# Patient Record
Sex: Female | Born: 1985 | Hispanic: Yes | Marital: Married | Smoking: Former smoker
Health system: Southern US, Community
[De-identification: ages and names within clinical notes are randomized; demographics above are authoritative.]

## PROBLEM LIST (undated history)

## (undated) DIAGNOSIS — N63 Unspecified lump in unspecified breast: Secondary | ICD-10-CM

## (undated) DIAGNOSIS — F32A Depression, unspecified: Secondary | ICD-10-CM

## (undated) DIAGNOSIS — R87629 Unspecified abnormal cytological findings in specimens from vagina: Secondary | ICD-10-CM

## (undated) DIAGNOSIS — F419 Anxiety disorder, unspecified: Secondary | ICD-10-CM

## (undated) DIAGNOSIS — N189 Chronic kidney disease, unspecified: Secondary | ICD-10-CM

## (undated) HISTORY — PX: BREAST SURGERY: SHX581

## (undated) HISTORY — DX: Depression, unspecified: F32.A

## (undated) HISTORY — DX: Unspecified lump in unspecified breast: N63.0

## (undated) HISTORY — PX: TONSILLECTOMY: SUR1361

## (undated) HISTORY — DX: Anxiety disorder, unspecified: F41.9

---

## 2013-03-23 ENCOUNTER — Encounter (HOSPITAL_COMMUNITY): Payer: Self-pay | Admitting: Emergency Medicine

## 2013-03-23 ENCOUNTER — Emergency Department (HOSPITAL_COMMUNITY)
Admission: EM | Admit: 2013-03-23 | Discharge: 2013-03-23 | Disposition: A | Discharge status: Home / Self Care | Payer: Self-pay | Attending: Emergency Medicine | Admitting: Emergency Medicine

## 2013-03-23 DIAGNOSIS — R52 Pain, unspecified: Secondary | ICD-10-CM | POA: Insufficient documentation

## 2013-03-23 DIAGNOSIS — Z3202 Encounter for pregnancy test, result negative: Secondary | ICD-10-CM | POA: Insufficient documentation

## 2013-03-23 DIAGNOSIS — J029 Acute pharyngitis, unspecified: Secondary | ICD-10-CM | POA: Insufficient documentation

## 2013-03-23 DIAGNOSIS — IMO0001 Reserved for inherently not codable concepts without codable children: Secondary | ICD-10-CM | POA: Insufficient documentation

## 2013-03-23 DIAGNOSIS — B9789 Other viral agents as the cause of diseases classified elsewhere: Secondary | ICD-10-CM | POA: Insufficient documentation

## 2013-03-23 DIAGNOSIS — B349 Viral infection, unspecified: Secondary | ICD-10-CM

## 2013-03-23 DIAGNOSIS — R5381 Other malaise: Secondary | ICD-10-CM | POA: Insufficient documentation

## 2013-03-23 DIAGNOSIS — R197 Diarrhea, unspecified: Secondary | ICD-10-CM | POA: Insufficient documentation

## 2013-03-23 DIAGNOSIS — R630 Anorexia: Secondary | ICD-10-CM | POA: Insufficient documentation

## 2013-03-23 LAB — CBC WITH DIFFERENTIAL/PLATELET
Hemoglobin: 13.9 g/dL (ref 12.0–15.0)
Lymphocytes Relative: 5 % — ABNORMAL LOW (ref 12–46)
Lymphs Abs: 0.7 10*3/uL (ref 0.7–4.0)
Monocytes Relative: 4 % (ref 3–12)
Neutrophils Relative %: 91 % — ABNORMAL HIGH (ref 43–77)
Platelets: 199 10*3/uL (ref 150–400)
RBC: 4.77 MIL/uL (ref 3.87–5.11)
WBC: 13.3 10*3/uL — ABNORMAL HIGH (ref 4.0–10.5)

## 2013-03-23 LAB — COMPREHENSIVE METABOLIC PANEL
ALT: 13 U/L (ref 0–35)
Alkaline Phosphatase: 69 U/L (ref 39–117)
BUN: 8 mg/dL (ref 6–23)
CO2: 25 mEq/L (ref 19–32)
Chloride: 97 mEq/L (ref 96–112)
GFR calc Af Amer: >90 mL/min (ref >90)
GFR calc non Af Amer: 86 mL/min — ABNORMAL LOW (ref >90)
Glucose, Bld: 118 mg/dL — ABNORMAL HIGH (ref 70–99)
Potassium: 3.3 mEq/L — ABNORMAL LOW (ref 3.5–5.1)
Sodium: 132 mEq/L — ABNORMAL LOW (ref 135–145)
Total Bilirubin: 0.3 mg/dL (ref 0.3–1.2)

## 2013-03-23 LAB — URINALYSIS, ROUTINE W REFLEX MICROSCOPIC
Hgb urine dipstick: NEGATIVE
Nitrite: NEGATIVE
Specific Gravity, Urine: 1.031 — ABNORMAL HIGH (ref 1.005–1.030)
Urobilinogen, UA: 1 mg/dL (ref 0.0–1.0)
pH: 6 (ref 5.0–8.0)

## 2013-03-23 LAB — URINE MICROSCOPIC-ADD ON

## 2013-03-23 MED ORDER — ACETAMINOPHEN 325 MG PO TABS
650.0000 mg | ORAL_TABLET | Freq: Once | ORAL | Status: AC
  Administered 2013-03-23: 650 mg via ORAL
  Filled 2013-03-23: qty 2

## 2013-03-23 NOTE — ED Provider Notes (Signed)
History     CSN: [id number]  Arrival date & time 03/23/13  1931   First MD Initiated Contact with Patient 03/23/13 2046      Chief Complaint  Patient presents with  . Fever   HPI  History provided by the patient. Patient is a 27 year old female with no significant PMH who presents with complaints of fever, chills and body aches. Symptoms first began Sunday evening and have been persistent. There were also accompanied with several episodes of soft diarrhea without blood or mucus. Patient also reports decreased appetite and some discomfort with swallowing in the throat neck area. Patient has been using DayQuil and NyQuil which has improved her fever temporarily. Diarrhea symptoms have also improved. Today patient reports having slight return of her appetite. She does also admit to poor fluid intake. She denies any other associated symptoms. No nausea vomiting. No abdominal pain. No dysuria, hematuria urinary frequency. No menstrual change. No vaginal bleeding or discharge. She denies any recent travel. No specific known sick contacts. 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    History reviewed. No pertinent past medical history.  Past Surgical History  Procedure Laterality Date  . Tonsillectomy    . Breast surgery      No family history on file.  History  Substance Use Topics  . Smoking status: Never Smoker   . Smokeless tobacco: Not on file  . Alcohol Use: No    OB History   Grav Para Term Preterm Abortions TAB SAB Ect Mult Living                  Review of Systems  Constitutional: Positive for fever, chills, appetite change and fatigue.  HENT: Positive for sore throat. Negative for congestion and rhinorrhea.   Respiratory: Negative for cough.   Cardiovascular: Negative for chest pain.  Gastrointestinal: Positive for diarrhea. Negative for nausea, vomiting, abdominal pain, constipation and blood in stool.  Genitourinary: Negative for dysuria, frequency,  hematuria, flank pain, vaginal bleeding, vaginal discharge and menstrual problem.  Musculoskeletal: Positive for myalgias.  All other systems reviewed and are negative.    Allergies  Review of patient's allergies indicates not on file.  Home Medications  No current outpatient prescriptions on file.  BP 122/85  Pulse 112  Temp(Src) 101.8 F (38.8 C) (Oral)  Resp 14  SpO2 99%  LMP 03/18/2013  Physical Exam  Nursing note and vitals reviewed. Constitutional: She is oriented to person, place, and time. She appears well-developed and well-nourished. No distress.  HENT:  Head: Normocephalic.  Right Ear: Tympanic membrane normal.  Left Ear: Tympanic membrane normal.  Mouth/Throat: Oropharynx is clear and moist.  No significant erythema of pharynx. Uvula midline. No ulcers or lesions of the mouth and gums. No petechia  Neck: Normal range of motion. Neck supple.  No meningeal signs  Cardiovascular: Normal rate and regular rhythm.   No murmur heard. Pulmonary/Chest: Effort normal and breath sounds normal. No respiratory distress. She has no wheezes. She has no rales.  Abdominal: Soft. There is no tenderness. There is no rebound.  Musculoskeletal: Normal range of motion. She exhibits no edema and no tenderness.  Lymphadenopathy:    She has no cervical adenopathy.  Neurological: She is alert and oriented to person, place, and time.  Skin: Skin is warm and dry. No rash noted.  Psychiatric: She has a normal mood and affect. Her behavior is normal.    ED Course  Procedures   Results for orders  placed during the hospital encounter of 03/23/13  URINALYSIS, ROUTINE W REFLEX MICROSCOPIC      Result Value Range   Color, Urine AMBER (*) YELLOW   APPearance HAZY (*) CLEAR   Specific Gravity, Urine 1.031 (*) 1.005 - 1.030   pH 6.0  5.0 - 8.0   Glucose, UA NEGATIVE  NEGATIVE mg/dL   Hgb urine dipstick NEGATIVE  NEGATIVE   Bilirubin Urine SMALL (*) NEGATIVE   Ketones, ur 40 (*) NEGATIVE  mg/dL   Protein, ur 30 (*) NEGATIVE mg/dL   Urobilinogen, UA 1.0  0.0 - 1.0 mg/dL   Nitrite NEGATIVE  NEGATIVE   Leukocytes, UA NEGATIVE  NEGATIVE  CBC WITH DIFFERENTIAL      Result Value Range   WBC 13.3 (*) 4.0 - 10.5 K/uL   RBC 4.77  3.87 - 5.11 MIL/uL   Hemoglobin 13.9  12.0 - 15.0 g/dL   HCT 16.1  09.6 - 04.5 %   MCV 83.6  78.0 - 100.0 fL   MCH 29.1  26.0 - 34.0 pg   MCHC 34.8  30.0 - 36.0 g/dL   RDW 40.9  81.1 - 91.4 %   Platelets 199  150 - 400 K/uL   Neutrophils Relative 91 (*) 43 - 77 %   Neutro Abs 12.1 (*) 1.7 - 7.7 K/uL   Lymphocytes Relative 5 (*) 12 - 46 %   Lymphs Abs 0.7  0.7 - 4.0 K/uL   Monocytes Relative 4  3 - 12 %   Monocytes Absolute 0.5  0.1 - 1.0 K/uL   Eosinophils Relative 0  0 - 5 %   Eosinophils Absolute 0.0  0.0 - 0.7 K/uL   Basophils Relative 0  0 - 1 %   Basophils Absolute 0.0  0.0 - 0.1 K/uL  COMPREHENSIVE METABOLIC PANEL      Result Value Range   Sodium 132 (*) 135 - 145 mEq/L   Potassium 3.3 (*) 3.5 - 5.1 mEq/L   Chloride 97  96 - 112 mEq/L   CO2 25  19 - 32 mEq/L   Glucose, Bld 118 (*) 70 - 99 mg/dL   BUN 8  6 - 23 mg/dL   Creatinine, Ser 7.82  0.50 - 1.10 mg/dL   Calcium 9.2  8.4 - 95.6 mg/dL   Total Protein 7.8  6.0 - 8.3 g/dL   Albumin 4.0  3.5 - 5.2 g/dL   AST 22  0 - 37 U/L   ALT 13  0 - 35 U/L   Alkaline Phosphatase 69  39 - 117 U/L   Total Bilirubin 0.3  0.3 - 1.2 mg/dL   GFR calc non Af Amer 86 (*) >90 mL/min   GFR calc Af Amer >90  >90 mL/min  URINE MICROSCOPIC-ADD ON      Result Value Range   Squamous Epithelial / LPF FEW (*) RARE   WBC, UA 0-2  <3 WBC/hpf   RBC / HPF 0-2  <3 RBC/hpf   Bacteria, UA FEW (*) RARE   Urine-Other MUCOUS PRESENT    POCT PREGNANCY, URINE      Result Value Range   Preg Test, Ur NEGATIVE  NEGATIVE        1. Viral infection       MDM  8:40 PM patient seen and evaluated. Patient in no acute distress. Patient well-appearing does not appear toxic. Symptoms consistent with viral  infection. Patient without nausea vomiting and able to tolerate by mouth fluids. She does show some  signs of slight dehydration. IV with fluids offered patient refused. She has been strongly encouraged to drink plenty of fluids.        Angus Seller, PA-C 03/23/13 2117

## 2013-03-23 NOTE — ED Provider Notes (Signed)
Medical screening examination/treatment/procedure(s) were performed by non-physician practitioner and as supervising physician I was immediately available for consultation/collaboration.  Lyanne Co, MD 03/23/13 [phone]

## 2013-03-23 NOTE — ED Notes (Signed)
PT. REPORTS FEVER FOR 3 DAYS WITH CHILLS , DENIES COUGH , DYSURIA OR PAIN . STATES MILD THROAT DISCOMFORT " HARD TO SWALLOW".

## 2014-08-03 ENCOUNTER — Other Ambulatory Visit: Payer: Self-pay

## 2018-11-18 LAB — OB RESULTS CONSOLE HEPATITIS B SURFACE ANTIGEN: Hepatitis B Surface Ag: NEGATIVE

## 2018-11-18 LAB — OB RESULTS CONSOLE RUBELLA ANTIBODY, IGM: Rubella: IMMUNE

## 2018-11-18 LAB — OB RESULTS CONSOLE GC/CHLAMYDIA
Chlamydia: NEGATIVE
Gonorrhea: NEGATIVE

## 2018-11-18 LAB — OB RESULTS CONSOLE ABO/RH: RH Type: POSITIVE

## 2018-11-18 LAB — OB RESULTS CONSOLE RPR: RPR: NONREACTIVE

## 2018-11-18 LAB — OB RESULTS CONSOLE ANTIBODY SCREEN: Antibody Screen: NEGATIVE

## 2018-11-18 LAB — OB RESULTS CONSOLE HIV ANTIBODY (ROUTINE TESTING): HIV: NONREACTIVE

## 2019-05-17 ENCOUNTER — Encounter (HOSPITAL_COMMUNITY): Payer: Self-pay | Admitting: *Deleted

## 2019-05-17 ENCOUNTER — Inpatient Hospital Stay (HOSPITAL_COMMUNITY)
Admission: AD | Admit: 2019-05-17 | Discharge: 2019-05-17 | Disposition: A | Discharge status: Home / Self Care | Payer: BLUE CROSS/BLUE SHIELD | Attending: Obstetrics and Gynecology | Admitting: Obstetrics and Gynecology

## 2019-05-17 ENCOUNTER — Other Ambulatory Visit: Payer: Self-pay

## 2019-05-17 DIAGNOSIS — O26833 Pregnancy related renal disease, third trimester: Secondary | ICD-10-CM | POA: Insufficient documentation

## 2019-05-17 DIAGNOSIS — Z87442 Personal history of urinary calculi: Secondary | ICD-10-CM | POA: Insufficient documentation

## 2019-05-17 DIAGNOSIS — R030 Elevated blood-pressure reading, without diagnosis of hypertension: Secondary | ICD-10-CM | POA: Insufficient documentation

## 2019-05-17 DIAGNOSIS — R609 Edema, unspecified: Secondary | ICD-10-CM | POA: Diagnosis not present

## 2019-05-17 DIAGNOSIS — Z881 Allergy status to other antibiotic agents status: Secondary | ICD-10-CM | POA: Diagnosis not present

## 2019-05-17 DIAGNOSIS — O163 Unspecified maternal hypertension, third trimester: Secondary | ICD-10-CM

## 2019-05-17 DIAGNOSIS — O26893 Other specified pregnancy related conditions, third trimester: Secondary | ICD-10-CM | POA: Insufficient documentation

## 2019-05-17 DIAGNOSIS — Z3A35 35 weeks gestation of pregnancy: Secondary | ICD-10-CM | POA: Insufficient documentation

## 2019-05-17 DIAGNOSIS — N189 Chronic kidney disease, unspecified: Secondary | ICD-10-CM | POA: Diagnosis not present

## 2019-05-17 DIAGNOSIS — R51 Headache: Secondary | ICD-10-CM | POA: Diagnosis not present

## 2019-05-17 DIAGNOSIS — Z3689 Encounter for other specified antenatal screening: Secondary | ICD-10-CM

## 2019-05-17 HISTORY — DX: Unspecified abnormal cytological findings in specimens from vagina: R87.629

## 2019-05-17 HISTORY — DX: Chronic kidney disease, unspecified: N18.9

## 2019-05-17 LAB — CBC
HCT: 33.9 % — ABNORMAL LOW (ref 36.0–46.0)
Hemoglobin: 11.5 g/dL — ABNORMAL LOW (ref 12.0–15.0)
MCH: 30.4 pg (ref 26.0–34.0)
MCHC: 33.9 g/dL (ref 30.0–36.0)
MCV: 89.7 fL (ref 80.0–100.0)
Platelets: 258 10*3/uL (ref 150–400)
RBC: 3.78 MIL/uL — ABNORMAL LOW (ref 3.87–5.11)
RDW: 12.8 % (ref 11.5–15.5)
WBC: 8.7 10*3/uL (ref 4.0–10.5)
nRBC: 0 % (ref 0.0–0.2)

## 2019-05-17 LAB — COMPREHENSIVE METABOLIC PANEL
ALT: 20 U/L (ref 0–44)
AST: 22 U/L (ref 15–41)
Albumin: 2.9 g/dL — ABNORMAL LOW (ref 3.5–5.0)
Alkaline Phosphatase: 68 U/L (ref 38–126)
Anion gap: 9 (ref 5–15)
BUN: 7 mg/dL (ref 6–20)
CO2: 22 mmol/L (ref 22–32)
Calcium: 9.3 mg/dL (ref 8.9–10.3)
Chloride: 107 mmol/L (ref 98–111)
Creatinine, Ser: 0.57 mg/dL (ref 0.44–1.00)
GFR calc Af Amer: >60 mL/min (ref >60)
GFR calc non Af Amer: >60 mL/min (ref >60)
Glucose, Bld: 125 mg/dL — ABNORMAL HIGH (ref 70–99)
Potassium: 3.7 mmol/L (ref 3.5–5.1)
Sodium: 138 mmol/L (ref 135–145)
Total Bilirubin: 0.5 mg/dL (ref 0.3–1.2)
Total Protein: 6.1 g/dL — ABNORMAL LOW (ref 6.5–8.1)

## 2019-05-17 LAB — URINALYSIS, ROUTINE W REFLEX MICROSCOPIC
Bilirubin Urine: NEGATIVE
Glucose, UA: NEGATIVE mg/dL
Hgb urine dipstick: NEGATIVE
Ketones, ur: 5 mg/dL — AB
Leukocytes,Ua: NEGATIVE
Nitrite: NEGATIVE
Protein, ur: NEGATIVE mg/dL
Specific Gravity, Urine: 1.009 (ref 1.005–1.030)
pH: 7 (ref 5.0–8.0)

## 2019-05-17 LAB — PROTEIN / CREATININE RATIO, URINE
Creatinine, Urine: 53.84 mg/dL
Total Protein, Urine: <6 mg/dL

## 2019-05-17 MED ORDER — ACETAMINOPHEN 500 MG PO TABS
1000.0000 mg | ORAL_TABLET | Freq: Four times a day (QID) | ORAL | Status: DC | PRN
  Administered 2019-05-17: 1000 mg via ORAL
  Filled 2019-05-17: qty 2

## 2019-05-17 NOTE — Discharge Instructions (Signed)
.  Preeclampsia and Eclampsia    Preeclampsia is a serious condition that may develop during pregnancy. It is also called toxemia of pregnancy. This condition causes high blood pressure along with other symptoms, such as swelling and headaches. These symptoms may develop as the condition gets worse. Preeclampsia may occur at 20 weeks of pregnancy or later.  Diagnosing and treating preeclampsia early is very important. If not treated early, it can cause serious problems for you and your baby. One problem it can lead to is eclampsia. Eclampsia is a condition that causes muscle jerking or shaking (convulsions or seizures) and other serious problems for the mother. During pregnancy, delivering your baby may be the best treatment for preeclampsia or eclampsia. For most women, preeclampsia and eclampsia symptoms go away after giving birth.  In rare cases, a woman may develop preeclampsia after giving birth (postpartum preeclampsia). This usually occurs within 48 hours after childbirth but may occur up to 6 weeks after giving birth.  What are the causes?  The cause of preeclampsia is not known.  What increases the risk?  The following risk factors make you more likely to develop preeclampsia:   Being pregnant for the first time.   Having had preeclampsia during a past pregnancy.   Having a family history of preeclampsia.   Having high blood pressure.   Being pregnant with more than one baby.   Being 35 or older.   Being African-American.   Having kidney disease or diabetes.   Having medical conditions such as lupus or blood diseases.   Being very overweight (obese).  What are the signs or symptoms?  The earliest signs of preeclampsia are:   High blood pressure.   Increased protein in your urine. Your health care provider will check for this at every visit before you give birth (prenatal visit).  Other symptoms that may develop as the condition gets worse include:   Severe headaches.   Sudden weight  gain.   Swelling of the hands, face, legs, and feet.   Nausea and vomiting.   Vision problems, such as blurred or double vision.   Numbness in the face, arms, legs, and feet.   Urinating less than usual.   Dizziness.   Slurred speech.   Abdominal pain, especially upper abdominal pain.   Convulsions or seizures.  How is this diagnosed?  There are no screening tests for preeclampsia. Your health care provider will ask you about symptoms and check for signs of preeclampsia during your prenatal visits. You may also have tests that include:   Urine tests.   Blood tests.   Checking your blood pressure.   Monitoring your baby's heart rate.   Ultrasound.  How is this treated?  You and your health care provider will determine the treatment approach that is best for you. Treatment may include:   Having more frequent prenatal exams to check for signs of preeclampsia, if you have an increased risk for preeclampsia.   Medicine to lower your blood pressure.   Staying in the hospital, if your condition is severe. There, treatment will focus on controlling your blood pressure and the amount of fluids in your body (fluid retention).   Taking medicine (magnesium sulfate) to prevent seizures. This may be given as an injection or through an IV.   Taking a low-dose aspirin during your pregnancy.   Delivering your baby early, if your condition gets worse. You may have your labor started with medicine (induced), or you may have a cesarean   delivery.  Follow these instructions at home:  Eating and drinking     Drink enough fluid to keep your urine pale yellow.   Avoid caffeine.  Lifestyle   Do not use any products that contain nicotine or tobacco, such as cigarettes and e-cigarettes. If you need help quitting, ask your health care provider.   Do not use alcohol or drugs.   Avoid stress as much as possible. Rest and get plenty of sleep.  General instructions   Take over-the-counter and prescription medicines only as  told by your health care provider.   When lying down, lie on your left side. This keeps pressure off your major blood vessels.   When sitting or lying down, raise (elevate) your feet. Try putting some pillows underneath your lower legs.   Exercise regularly. Ask your health care provider what kinds of exercise are best for you.   Keep all follow-up and prenatal visits as told by your health care provider. This is important.  How is this prevented?  There is no known way of preventing preeclampsia or eclampsia from developing. However, to lower your risk of complications and detect problems early:   Get regular prenatal care. Your health care provider may be able to diagnose and treat the condition early.   Maintain a healthy weight. Ask your health care provider for help managing weight gain during pregnancy.   Work with your health care provider to manage any long-term (chronic) health conditions you have, such as diabetes or kidney problems.   You may have tests of your blood pressure and kidney function after giving birth.   Your health care provider may have you take low-dose aspirin during your next pregnancy.  Contact a health care provider if:   You have symptoms that your health care provider told you may require more treatment or monitoring, such as:  ? Headaches.  ? Nausea or vomiting.  ? Abdominal pain.  ? Dizziness.  ? Light-headedness.  Get help right away if:   You have severe:  ? Abdominal pain.  ? Headaches that do not get better.  ? Dizziness.  ? Vision problems.  ? Confusion.  ? Nausea or vomiting.   You have any of the following:  ? A seizure.  ? Sudden, rapid weight gain.  ? Sudden swelling in your hands, ankles, or face.  ? Trouble moving any part of your body.  ? Numbness in any part of your body.  ? Trouble speaking.  ? Abnormal bleeding.   You faint.  Summary   Preeclampsia is a serious condition that may develop during pregnancy. It is also called toxemia of pregnancy.   This  condition causes high blood pressure along with other symptoms, such as swelling and headaches.   Diagnosing and treating preeclampsia early is very important. If not treated early, it can cause serious problems for you and your baby.   Get help right away if you have symptoms that your health care provider told you to watch for.  This information is not intended to replace advice given to you by your health care provider. Make sure you discuss any questions you have with your health care provider.  Document Released: 11/29/2000 Document Revised: 11/18/2017 Document Reviewed: 07/08/2016  Elsevier Interactive Patient Education  2019 [employer]

## 2019-05-17 NOTE — MAU Note (Signed)
Pt noticed swollen feet last night, but says they had been somewhat swollen throughout this preg since 12 weeks; but today they were much more swollen and painful to touch. She also reports a slight dull, throbbing headache since about 1630- 1700. Did not take anything for the headache but says she would like something for it now. Reports fetal movement today.

## 2019-05-17 NOTE — MAU Provider Note (Signed)
History     CSN: [id number]  Arrival date and time: 05/17/19 1844   First Provider Initiated Contact with Patient 05/17/19 1930      No chief complaint on file.  Rachel Wright is a 33 y.o. G1P0 at [redacted]w[redacted]d who presents to MAU for preeclampsia evaluation after she experienced painful, swollen feet since last night. Pt reports she was traveling in a car today and sitting for several hours.  Pt reports mother had preeclampsia when she was pregnant with her.  Pt reports HA today starting a few hours ago, about 1600. Pt believes it could be because she has not eaten well today, and reports she did eat half a sandwich on the way here, which lessened her HA. Pt rates HA as 3/10. Pt denies hx of HA issues prior to pregnancy. Pt denies taking anything for HA today.  Pt denies blurry vision/seeing spots, N/V, epigastric pain, swelling in face and hands, sudden weight gain. Pt denies chest pain and SOB.  Pt denies constipation, diarrhea, or urinary problems. Pt denies fever, chills, fatigue, sweating or changes in appetite. Pt denies dizziness, light-headedness, weakness.  Pt denies VB, ctx, LOF and reports good FM.  Current pregnancy problems? none Allergies? Ceclor Current medications? PNVs, Pepcid A/C, Zyrtec Current P[facility] & next appt? [facility], next appt tomorrow   OB History    Gravida  1   Para      Term      Preterm      AB      Living        SAB      TAB      Ectopic      Multiple      Live Births              Past Medical History:  Diagnosis Date  . Chronic kidney disease    kidney stones  . Vaginal Pap smear, abnormal     Past Surgical History:  Procedure Laterality Date  . BREAST SURGERY     lumpectomy  . TONSILLECTOMY      History reviewed. No pertinent family history.  Social History   Tobacco Use  . Smoking status: Never Smoker  . Smokeless tobacco: Never Used  Substance Use Topics  . Alcohol use: No  . Drug use:  No    Allergies:  Allergies  Allergen Reactions  . Ceclor [Cefaclor] Rash    Medications Prior to Admission  Medication Sig Dispense Refill Last Dose  . cetirizine (ZYRTEC) 10 MG tablet Take 10 mg by mouth daily.   05/17/2019 at Unknown time  . famotidine (PEPCID) 20 MG tablet Take 20 mg by mouth 2 (two) times daily.   05/17/2019 at Unknown time  . Multiple Vitamins-Minerals (MULTIVITAMIN PO) Take 1 tablet by mouth daily.   05/16/2019 at Unknown time  . Pseudoeph-Doxylamine-DM-APAP (NYQUIL D COLD/FLU) 60-12.05-14-999 MG/30ML LIQD Take 30 mLs by mouth daily as needed (for cold/flu symptoms).   More than a month at Unknown time    Review of Systems  Constitutional: Negative for chills, diaphoresis, fatigue and fever.  Respiratory: Negative for shortness of breath.   Cardiovascular: Negative for chest pain.  Gastrointestinal: Negative for abdominal distention, constipation, diarrhea, nausea and vomiting.  Genitourinary: Negative for dysuria, flank pain, frequency, pelvic pain, urgency, vaginal bleeding and vaginal discharge.  Musculoskeletal:       Bilateral pedal and ankle edema  Neurological: Positive for headaches. Negative for dizziness, weakness and light-headedness.  Physical Exam   Blood pressure (!) 143/95, pulse 89, temperature 98.5 F (36.9 C), temperature source Oral, resp. rate 16, height 5\' 4"  (1.626 m), weight 85.7 kg, SpO2 98 %.  Patient Vitals for the past 24 hrs:  BP Temp Temp src Pulse Resp SpO2 Height Weight  05/17/19 2030 (!) 143/95 - - 89 - 98 % - -  05/17/19 2015 (!) 139/98 - - 97 - 98 % - -  05/17/19 2000 (!) 138/99 - - (!) 104 - 97 % - -  05/17/19 1945 (!) 142/95 - - 97 - 98 % - -  05/17/19 1929 (!) 143/98 - - (!) 105 - - - -  05/17/19 1928 (!) 143/98 - - (!) 102 - - - -  05/17/19 1912 (!) 151/99 98.5 F (36.9 C) Oral - 16 - 5\' 4"  (1.626 m) 85.7 kg   Physical Exam  Constitutional: She is oriented to person, place, and time. She appears well-developed and  well-nourished. No distress.  HENT:  Head: Normocephalic and atraumatic.  Respiratory: Effort normal.  GI: Soft. She exhibits no distension and no mass. There is no abdominal tenderness. There is no rebound and no guarding.  Musculoskeletal:     Comments: Bilateral, 2+ pitting pedal edema, and edema of ankles bilaterally without pitting  Neurological: She is alert and oriented to person, place, and time.  Skin: Skin is warm and dry. She is not diaphoretic.  Psychiatric: She has a normal mood and affect. Her behavior is normal. Judgment and thought content normal.   Results for orders placed or performed during the hospital encounter of 05/17/19 (from the past 24 hour(s))  Urinalysis, Routine w reflex microscopic     Status: Abnormal   Collection Time: 05/17/19  7:35 PM  Result Value Ref Range   Color, Urine YELLOW YELLOW   APPearance CLEAR CLEAR   Specific Gravity, Urine 1.009 1.005 - 1.030   pH 7.0 5.0 - 8.0   Glucose, UA NEGATIVE NEGATIVE mg/dL   Hgb urine dipstick NEGATIVE NEGATIVE   Bilirubin Urine NEGATIVE NEGATIVE   Ketones, ur 5 (A) NEGATIVE mg/dL   Protein, ur NEGATIVE NEGATIVE mg/dL   Nitrite NEGATIVE NEGATIVE   Leukocytes,Ua NEGATIVE NEGATIVE  Protein / creatinine ratio, urine     Status: None   Collection Time: 05/17/19  7:35 PM  Result Value Ref Range   Creatinine, Urine 53.84 mg/dL   Total Protein, Urine <6 mg/dL   Protein Creatinine Ratio        0.00 - 0.15 mg/mg[Cre]  CBC     Status: Abnormal   Collection Time: 05/17/19  7:44 PM  Result Value Ref Range   WBC 8.7 4.0 - 10.5 K/uL   RBC 3.78 (L) 3.87 - 5.11 MIL/uL   Hemoglobin 11.5 (L) 12.0 - 15.0 g/dL   HCT 16.133.9 (L) 09.636.0 - 04.546.0 %   MCV 89.7 80.0 - 100.0 fL   MCH 30.4 26.0 - 34.0 pg   MCHC 33.9 30.0 - 36.0 g/dL   RDW 40.912.8 81.111.5 - 91.415.5 %   Platelets 258 150 - 400 K/uL   nRBC 0.0 0.0 - 0.2 %  Comprehensive metabolic panel     Status: Abnormal   Collection Time: 05/17/19  7:44 PM  Result Value Ref Range    Sodium 138 135 - 145 mmol/L   Potassium 3.7 3.5 - 5.1 mmol/L   Chloride 107 98 - 111 mmol/L   CO2 22 22 - 32 mmol/L   Glucose, Bld 125 (H) 70 -  99 mg/dL   BUN 7 6 - 20 mg/dL   Creatinine, Ser 1.27 0.44 - 1.00 mg/dL   Calcium 9.3 8.9 - 51.7 mg/dL   Total Protein 6.1 (L) 6.5 - 8.1 g/dL   Albumin 2.9 (L) 3.5 - 5.0 g/dL   AST 22 15 - 41 U/L   ALT 20 0 - 44 U/L   Alkaline Phosphatase 68 38 - 126 U/L   Total Bilirubin 0.5 0.3 - 1.2 mg/dL   GFR calc non Af Amer >60 >60 mL/min   GFR calc Af Amer >60 >60 mL/min   Anion gap 9 5 - 15   No results found.  MAU Course  Procedures  MDM -preeclampsia work-up -bilateral 2+ pitting pedal edema and bilateral ankle edema -HA 3/10, 1000mg  Tylenol given, pt reports HA 0/10 -UA: 5ketones, otherwise WNL -PCr: result below reportable range, unable to calculate -CBC: WNL for pregnancy, platelets 258 -CMP: no abnormalities requiring treatment, AST/ALT 22/20 -EFM: reactive       -baseline: 140-135       -variability: moderate       -accels: present, 15x15       -decels: absent       -TOCO: irritability -no severe features or lab abnormalities related to preeclampsia -pt discharged to home in stable condition  Orders Placed This Encounter  Procedures  . Urinalysis, Routine w reflex microscopic    Standing Status:   Standing    Number of Occurrences:   1  . CBC    Standing Status:   Standing    Number of Occurrences:   1  . Comprehensive metabolic panel    Standing Status:   Standing    Number of Occurrences:   1  . Protein / creatinine ratio, urine    Standing Status:   Standing    Number of Occurrences:   1  . Discharge patient    Order Specific Question:   Discharge disposition    Answer:   01-Home or Self Care [1]    Order Specific Question:   Discharge patient date    Answer:   05/17/2019   Meds ordered this encounter  Medications  . acetaminophen (TYLENOL) tablet 1,000 mg   Assessment and Plan   1. Elevated blood pressure  affecting pregnancy in third trimester, antepartum   2. [redacted] weeks gestation of pregnancy   3. NST (non-stress test) reactive   4. Pregnancy headache in third trimester   5. 2+ pitting edema    Allergies as of 05/17/2019      Reactions   Ceclor [cefaclor] Rash      Medication List    TAKE these medications   cetirizine 10 MG tablet Commonly known as:  ZYRTEC Take 10 mg by mouth daily.   famotidine 20 MG tablet Commonly known as:  PEPCID Take 20 mg by mouth 2 (two) times daily.   MULTIVITAMIN PO Take 1 tablet by mouth daily.   NyQuil D Cold/Flu 60-12.05-14-999 MG/30ML Liqd Generic drug:  Pseudoeph-Doxylamine-DM-APAP Take 30 mLs by mouth daily as needed (for cold/flu symptoms).      -discussed use of compression stockings and other non-pharmacologic means of reducing lower extremity edema -pt strongly advised to keep OB appt tomorrow -pt to request BP cuff from OB at visit tomorrow -strict preeclampsia/return MAU precautions given -pt discharged to home in stable condition  Joni Reining E Haille Pardi 05/17/2019, 8:52 PM

## 2019-05-25 ENCOUNTER — Other Ambulatory Visit (HOSPITAL_COMMUNITY)
Admission: RE | Admit: 2019-05-25 | Discharge: 2019-05-25 | Disposition: A | Discharge status: Home / Self Care | Payer: BLUE CROSS/BLUE SHIELD | Source: Ambulatory Visit | Attending: Obstetrics and Gynecology | Admitting: Obstetrics and Gynecology

## 2019-05-25 ENCOUNTER — Other Ambulatory Visit: Payer: Self-pay | Admitting: Advanced Practice Midwife

## 2019-05-25 ENCOUNTER — Inpatient Hospital Stay (HOSPITAL_COMMUNITY)
Admission: AD | Admit: 2019-05-25 | Discharge: 2019-05-25 | Discharge status: Absent without leave | Payer: BLUE CROSS/BLUE SHIELD | Attending: Obstetrics and Gynecology | Admitting: Obstetrics and Gynecology

## 2019-05-25 ENCOUNTER — Telehealth (HOSPITAL_COMMUNITY): Payer: Self-pay

## 2019-05-25 ENCOUNTER — Other Ambulatory Visit: Payer: Self-pay

## 2019-05-25 ENCOUNTER — Encounter (HOSPITAL_COMMUNITY): Payer: Self-pay

## 2019-05-25 DIAGNOSIS — Z1159 Encounter for screening for other viral diseases: Secondary | ICD-10-CM | POA: Diagnosis not present

## 2019-05-25 DIAGNOSIS — Z01812 Encounter for preprocedural laboratory examination: Secondary | ICD-10-CM | POA: Diagnosis not present

## 2019-05-25 NOTE — MAU Note (Signed)
Pt for version on Thur.  Asymptomatic. Collected without difficulty.

## 2019-05-25 NOTE — H&P (Signed)
Rachel Wright is a 33 y.o. female presenting for ECV. She was found to be breech in the office at 46 wga. Pregnancy complicated by IVF pregnancy with donor egg s/s decreased ovarian reserve. Last US showed normal fluid and EFW 70%-ile. She did have a marginal cord insertion that was questionable. However, this is not a contraindication to a version. THIS GENDER IS A SUPRISE.   OB History    Gravida  1   Para      Term      Preterm      AB      Living        SAB      TAB      Ectopic      Multiple      Live Births             Past Medical History:  Diagnosis Date  . Chronic kidney disease    kidney stones  . Newborn product of in vitro fertilization (IVF) pregnancy   . Vaginal Pap smear, abnormal    Past Surgical History:  Procedure Laterality Date  . BREAST SURGERY     lumpectomy  . TONSILLECTOMY     Family History: family history includes Anxiety disorder in her mother; Breast cancer in her maternal grandmother and mother; Cervical cancer in her mother; Deep vein thrombosis in her mother; Depression in her mother; Heart disease in her mother; Hypertension in her mother. Social History:  reports that she has never smoked. She has never used smokeless tobacco. She reports that she does not drink alcohol or use drugs.     Maternal Diabetes: No Genetic Screening: Normal Maternal Ultrasounds/Referrals: Normal Fetal Ultrasounds or other Referrals:  None Maternal Substance Abuse:  No Significant Maternal Medications:  None Significant Maternal Lab Results:  None Other Comments:  None  ROS History   There were no vitals taken for this visit. Exam Physical Exam  Prenatal labs: ABO, Rh: A/Positive/-- (12/04 0000) Antibody: Negative (12/04 0000) Rubella: Immune (12/04 0000) RPR: Nonreactive (12/04 0000)  HBsAg: Negative (12/04 0000)  HIV: Non-reactive (12/04 0000)  GBS:     Assessment/Plan: 33 yo G1P0 presenting for scheduled ECV. Her pregnancy is  c/b IVF with donor egg and marginal cord insertion. She is RH positive and does not need Rhogam. She was counseled regarding the options for breech presentation including ECV or scheduled CS. After careful consideration, weighing the risks and benefits of each, she elected a ECV. We specifically discussed the risks including nrFHT, cord prolapse, abruption, ROM, labor, Fetomaternal hemorrhage, the need for emergency CS, IUFD, failure, and reaction to terbutaline.  For her specifically, while not contraindicated, given marginal cord insertion, there is a slightly higher risk of placental abruption. She strongly desires an ECV and consents to the risks.  We discussed the typical process and she agrees to proceed. If unsuccessful, she understand that she will likely need a cesarean delivery.  [place] in office prior to the procedure confirms frank breech presentation. Will repeat on day of ECV. Terbutaline 15 min prior to ECV is planned.    Tyson Dense 05/25/2019, 12:31 PM

## 2019-05-25 NOTE — H&P (Deleted)
  The note originally documented on this encounter has been moved the the encounter in which it belongs.  

## 2019-05-25 NOTE — Telephone Encounter (Signed)
Preadmission screen  

## 2019-05-26 ENCOUNTER — Other Ambulatory Visit (HOSPITAL_COMMUNITY): Payer: Self-pay | Admitting: *Deleted

## 2019-05-26 LAB — NOVEL CORONAVIRUS, NAA (HOSP ORDER, SEND-OUT TO REF LAB; TAT 18-24 HRS): SARS-CoV-2, NAA: NOT DETECTED

## 2019-05-27 ENCOUNTER — Encounter (HOSPITAL_COMMUNITY): Payer: Self-pay | Admitting: *Deleted

## 2019-05-27 ENCOUNTER — Inpatient Hospital Stay (HOSPITAL_COMMUNITY): Payer: BLUE CROSS/BLUE SHIELD

## 2019-05-27 ENCOUNTER — Observation Stay (HOSPITAL_COMMUNITY)
Admission: AD | Admit: 2019-05-27 | Discharge: 2019-05-27 | Disposition: A | Discharge status: Home / Self Care | Payer: BC Managed Care – PPO | Source: Ambulatory Visit | Attending: Obstetrics and Gynecology | Admitting: Obstetrics and Gynecology

## 2019-05-27 DIAGNOSIS — O321XX0 Maternal care for breech presentation, not applicable or unspecified: Principal | ICD-10-CM | POA: Diagnosis present

## 2019-05-27 DIAGNOSIS — N189 Chronic kidney disease, unspecified: Secondary | ICD-10-CM | POA: Diagnosis not present

## 2019-05-27 DIAGNOSIS — O26833 Pregnancy related renal disease, third trimester: Secondary | ICD-10-CM | POA: Insufficient documentation

## 2019-05-27 DIAGNOSIS — Z87442 Personal history of urinary calculi: Secondary | ICD-10-CM | POA: Diagnosis not present

## 2019-05-27 DIAGNOSIS — Z3A37 37 weeks gestation of pregnancy: Secondary | ICD-10-CM | POA: Insufficient documentation

## 2019-05-27 LAB — CBC
HCT: 35.6 % — ABNORMAL LOW (ref 36.0–46.0)
Hemoglobin: 11.9 g/dL — ABNORMAL LOW (ref 12.0–15.0)
MCH: 30.1 pg (ref 26.0–34.0)
MCHC: 33.4 g/dL (ref 30.0–36.0)
MCV: 90.1 fL (ref 80.0–100.0)
Platelets: 241 10*3/uL (ref 150–400)
RBC: 3.95 MIL/uL (ref 3.87–5.11)
RDW: 13 % (ref 11.5–15.5)
WBC: 9.4 10*3/uL (ref 4.0–10.5)
nRBC: 0 % (ref 0.0–0.2)

## 2019-05-27 LAB — TYPE AND SCREEN
ABO/RH(D): A POS
Antibody Screen: POSITIVE

## 2019-05-27 MED ORDER — LACTATED RINGERS IV SOLN: 500.0000 mL | INTRAVENOUS | Status: DC | PRN

## 2019-05-27 MED ORDER — OXYCODONE-ACETAMINOPHEN 5-325 MG PO TABS: 1.0000 | ORAL_TABLET | ORAL | Status: DC | PRN

## 2019-05-27 MED ORDER — OXYCODONE-ACETAMINOPHEN 5-325 MG PO TABS: 2.0000 | ORAL_TABLET | ORAL | Status: DC | PRN

## 2019-05-27 MED ORDER — TERBUTALINE SULFATE 1 MG/ML IJ SOLN
INTRAMUSCULAR | Status: AC
  Filled 2019-05-27: qty 1

## 2019-05-27 MED ORDER — TERBUTALINE SULFATE 1 MG/ML IJ SOLN
0.2500 mg | Freq: Once | INTRAMUSCULAR | Status: AC
  Administered 2019-05-27: 0.25 mg via SUBCUTANEOUS

## 2019-05-27 MED ORDER — LACTATED RINGERS IV SOLN
INTRAVENOUS | Status: DC
  Administered 2019-05-27: 08:00:00 via INTRAVENOUS

## 2019-05-27 MED ORDER — ACETAMINOPHEN 325 MG PO TABS: 650.0000 mg | ORAL_TABLET | ORAL | Status: DC | PRN

## 2019-05-27 NOTE — Progress Notes (Signed)
Dr Royston Sinner notified of BPs - no orders received - pt to follow up at next scheduled appointment - [place]

## 2019-05-27 NOTE — Op Note (Signed)
Date: 05/27/2019 Surgeon: Dr. Lucillie Garfinkel  PreOp Dx: Breech presentation PostOp Dx: unsuccessful version, remains frank breech presentation   Operation: External Cephalic Version Findings: Stable fetal HR pre and post procedure  Clinical Note: This is a 33 yo G1P0 @ 37.0 wga presenting for ECV.  A reactive fetal heart tracing was obtained prior to procedure. Risks of ECV, including abnormal fHR, PROM, abruption, injury to fetus, possible emergency C/S, and failed ECV were discussed with the patient and consent was obtained.  Procedure Note: A bedside ultrasound was performed which confirmed the single intrauterine pregnancy with frank breech presentation. There was noted to be adequate fluid. The vertex was noted in RUQ. Using manual pressure, the fetus was manipulated with gentle pressure from the palms against the buttock and posterior occupit to stimulate a backward roll, carefully avoiding cord insertion. In total,3 attempts where made. Fetal HRs were obtained between each attempt and were reassuring, between 125-135 after each attempt. On the final attempt, the fetus shifted to Vertex being slightly low but remained breech. Following the procedure, Cord insertion was evaluated with good blood flow. she was noted to have a reassuring and reactive tracing for 1 hour post procedure. She did not have any regular contractions and there were no signs of PROM. She was discharged home with instructions on reasons to return to L&D and otherwise she will follow up in clinic in one week.    Arty Baumgartner MD

## 2019-05-27 NOTE — Discharge Instructions (Signed)
Premature Rupture and Preterm Premature Rupture of Membranes ° °A sac made up of membranes surrounds your baby in the womb (uterus). Rupture of membranes is when this sac breaks open. This is also known as your "water breaking." When this sac breaks before labor starts, it is called premature rupture of membranes (PROM). If this happens before 37 weeks of being pregnant, it is called preterm premature rupture of membranes (PPROM). PPROM is serious. It needs medical care right away. °What increases the risk of PPROM? °PPROM is more likely to happen in women who: °· Have an infection. °· Have had PPROM before. °· Have a cervix that is short. °· Have bleeding during the second or third trimester. °· Have a low BMI. This is a measure of body fat. °· Smoke. °· Use drugs. °· Have a low socioeconomic status. °What problems can be caused by PROM and PPROM? °This condition creates health dangers for the mother and the baby. These include: °· Giving birth to the baby too early (prematurely). °· Getting a serious infection of the placenta (chorioamnionitis). °· Having the placenta detach from the uterus early (placental abruption). °· Squeezing of the umbilical cord. °· Getting a serious infection after delivery. °What are the signs of PROM and PPROM? °· A sudden gush of fluid from the vagina. °· A slow leak of fluid from the vagina. °· Your underwear is wet. °What should I do if I think my water broke? °Call your doctor right away. You will need to go to the hospital to get checked right away. °What happens if I am told that I have PROM or PPROM? °You will have tests done at the hospital. °· If you have PROM, you may be given medicine to start labor (be induced). This may be done if you are not having contractions during the 24 hours after your water broke. °· If you have PPROM and are not having contractions, you may be given medicine to start labor. It will depend on how far along you are in your pregnancy. °If you have  PPROM: °· You and your baby will be watched closely to see if you have infections or other problems. °· You may be given: °? An antibiotic medicine. This can stop an infection from starting. °? A steroid medicine. This can help your baby's lungs develop faster. °? A medicine to help prevent cerebral palsy in your baby. °? A medicine to stop early labor (preterm labor). °· You may be told to stay in bed except to use the bathroom (bed rest). °· You may be given medicine to start labor. This may be done if there are problems with you or the baby. °Your treatment will depend on many factors. °Contact a doctor if: °· Your water breaks and you are not having contractions. °Get help right away if: °· Your water breaks before you are [redacted] weeks pregnant. °Summary °· When your water breaks before labor starts, it is called premature rupture of membranes (PROM). °· When PROM happens before 37 weeks of pregnancy, it is called preterm premature rupture of membranes (PPROM). °· If you are not having contractions, your labor may be started for you. °This information is not intended to replace advice given to you by your health care provider. Make sure you discuss any questions you have with your health care provider. °Document Released: 02/28/2009 Document Revised: 07/18/2017 Document Reviewed: 08/22/2016 °Elsevier Interactive Patient Education © 2019 [employer] °Preterm Labor and Birth Information ° °The normal length of   a pregnancy is 39-41 weeks. Preterm labor is when labor starts before 37 completed weeks of pregnancy. °What are the risk factors for preterm labor? °Preterm labor is more likely to occur in women who: °· Have certain infections during pregnancy such as a bladder infection, sexually transmitted infection, or infection inside the uterus (chorioamnionitis). °· Have a shorter-than-normal cervix. °· Have gone into preterm labor before. °· Have had surgery on their cervix. °· Are younger than age 17 or older than  age 35. °· Are African American. °· Are pregnant with twins or multiple babies (multiple gestation). °· Take street drugs or smoke while pregnant. °· Do not gain enough weight while pregnant. °· Became pregnant shortly after having been pregnant. °What are the symptoms of preterm labor? °Symptoms of preterm labor include: °· Cramps similar to those that can happen during a menstrual period. The cramps may happen with diarrhea. °· Pain in the abdomen or lower back. °· Regular uterine contractions that may feel like tightening of the abdomen. °· A feeling of increased pressure in the pelvis. °· Increased watery or bloody mucus discharge from the vagina. °· Water breaking (ruptured amniotic sac). °Why is it important to recognize signs of preterm labor? °It is important to recognize signs of preterm labor because babies who are born prematurely may not be fully developed. This can put them at an increased risk for: °· Long-term (chronic) heart and lung problems. °· Difficulty immediately after birth with regulating body systems, including blood sugar, body temperature, heart rate, and breathing rate. °· Bleeding in the brain. °· Cerebral palsy. °· Learning difficulties. °· Death. °These risks are highest for babies who are born before 34 weeks of pregnancy. °How is preterm labor treated? °Treatment depends on the length of your pregnancy, your condition, and the health of your baby. It may involve: °· Having a stitch (suture) placed in your cervix to prevent your cervix from opening too early (cerclage). °· Taking or being given medicines, such as: °? Hormone medicines. These may be given early in pregnancy to help support the pregnancy. °? Medicine to stop contractions. °? Medicines to help mature the baby’s lungs. These may be prescribed if the risk of delivery is high. °? Medicines to prevent your baby from developing cerebral palsy. °If the labor happens before 34 weeks of pregnancy, you may need to stay in the  hospital. °What should I do if I think I am in preterm labor? °If you think that you are going into preterm labor, call your health care provider right away. °How can I prevent preterm labor in future pregnancies? °To increase your chance of having a full-term pregnancy: °· Do not use any tobacco products, such as cigarettes, chewing tobacco, and e-cigarettes. If you need help quitting, ask your health care provider. °· Do not use street drugs or medicines that have not been prescribed to you during your pregnancy. °· Talk with your health care provider before taking any herbal supplements, even if you have been taking them regularly. °· Make sure you gain a healthy amount of weight during your pregnancy. °· Watch for infection. If you think that you might have an infection, get it checked right away. °· Make sure to tell your health care provider if you have gone into preterm labor before. °This information is not intended to replace advice given to you by your health care provider. Make sure you discuss any questions you have with your health care provider. °Document Released: 02/22/2004 Document Revised: 05/14/2016   Document Reviewed: 04/24/2016 °Elsevier Interactive Patient Education © 2019 Elsevier Inc. ° °

## 2019-06-01 ENCOUNTER — Inpatient Hospital Stay (HOSPITAL_COMMUNITY): Payer: BC Managed Care – PPO | Admitting: Anesthesiology

## 2019-06-01 ENCOUNTER — Encounter (HOSPITAL_COMMUNITY)
Admission: AD | Disposition: A | Discharge status: Home / Self Care | Payer: Self-pay | Source: Home / Self Care | Attending: Obstetrics and Gynecology

## 2019-06-01 ENCOUNTER — Other Ambulatory Visit: Payer: Self-pay

## 2019-06-01 ENCOUNTER — Inpatient Hospital Stay (HOSPITAL_COMMUNITY)
Admission: AD | Admit: 2019-06-01 | Discharge: 2019-06-04 | DRG: 788 | Disposition: A | Discharge status: Home / Self Care | Payer: BC Managed Care – PPO | Attending: Obstetrics and Gynecology | Admitting: Obstetrics and Gynecology

## 2019-06-01 ENCOUNTER — Encounter (HOSPITAL_COMMUNITY): Payer: Self-pay | Admitting: *Deleted

## 2019-06-01 DIAGNOSIS — Z1159 Encounter for screening for other viral diseases: Secondary | ICD-10-CM

## 2019-06-01 DIAGNOSIS — Z3A37 37 weeks gestation of pregnancy: Secondary | ICD-10-CM | POA: Diagnosis not present

## 2019-06-01 DIAGNOSIS — O4292 Full-term premature rupture of membranes, unspecified as to length of time between rupture and onset of labor: Secondary | ICD-10-CM | POA: Diagnosis present

## 2019-06-01 DIAGNOSIS — O321XX0 Maternal care for breech presentation, not applicable or unspecified: Secondary | ICD-10-CM | POA: Diagnosis present

## 2019-06-01 LAB — CBC
HCT: 35.5 % — ABNORMAL LOW (ref 36.0–46.0)
Hemoglobin: 11.7 g/dL — ABNORMAL LOW (ref 12.0–15.0)
MCH: 29.9 pg (ref 26.0–34.0)
MCHC: 33 g/dL (ref 30.0–36.0)
MCV: 90.8 fL (ref 80.0–100.0)
Platelets: 246 10*3/uL (ref 150–400)
RBC: 3.91 MIL/uL (ref 3.87–5.11)
RDW: 12.9 % (ref 11.5–15.5)
WBC: 8.7 10*3/uL (ref 4.0–10.5)
nRBC: 0 % (ref 0.0–0.2)

## 2019-06-01 LAB — SARS CORONAVIRUS 2: SARS Coronavirus 2: NOT DETECTED

## 2019-06-01 LAB — RPR: RPR Ser Ql: NONREACTIVE

## 2019-06-01 SURGERY — Surgical Case
Anesthesia: Spinal | Wound class: Clean Contaminated

## 2019-06-01 MED ORDER — HYDROMORPHONE HCL 1 MG/ML IJ SOLN: 0.2500 mg | INTRAMUSCULAR | Status: DC | PRN

## 2019-06-01 MED ORDER — FENTANYL CITRATE (PF) 100 MCG/2ML IJ SOLN
INTRAMUSCULAR | Status: AC
  Filled 2019-06-01: qty 2

## 2019-06-01 MED ORDER — NALOXONE HCL 0.4 MG/ML IJ SOLN: 0.4000 mg | INTRAMUSCULAR | Status: DC | PRN

## 2019-06-01 MED ORDER — SIMETHICONE 80 MG PO CHEW
80.0000 mg | CHEWABLE_TABLET | ORAL | Status: DC
  Administered 2019-06-01 – 2019-06-03 (×3): 80 mg via ORAL
  Filled 2019-06-01 (×3): qty 1

## 2019-06-01 MED ORDER — IBUPROFEN 600 MG PO TABS
600.0000 mg | ORAL_TABLET | Freq: Four times a day (QID) | ORAL | Status: DC
  Administered 2019-06-01 – 2019-06-04 (×11): 600 mg via ORAL
  Filled 2019-06-01 (×11): qty 1

## 2019-06-01 MED ORDER — PRENATAL MULTIVITAMIN CH
1.0000 | ORAL_TABLET | Freq: Every day | ORAL | Status: DC
  Administered 2019-06-02 – 2019-06-03 (×2): 1 via ORAL
  Filled 2019-06-01 (×2): qty 1

## 2019-06-01 MED ORDER — SODIUM CHLORIDE 0.9 % IR SOLN
Status: DC | PRN
  Administered 2019-06-01: 1

## 2019-06-01 MED ORDER — FAMOTIDINE 20 MG PO TABS
20.0000 mg | ORAL_TABLET | Freq: Two times a day (BID) | ORAL | Status: DC
  Administered 2019-06-01 – 2019-06-04 (×6): 20 mg via ORAL
  Filled 2019-06-01 (×6): qty 1

## 2019-06-01 MED ORDER — SOD CITRATE-CITRIC ACID 500-334 MG/5ML PO SOLN
30.0000 mL | Freq: Once | ORAL | Status: AC
  Administered 2019-06-01: 09:00:00 30 mL via ORAL
  Filled 2019-06-01: qty 30

## 2019-06-01 MED ORDER — TETANUS-DIPHTH-ACELL PERTUSSIS 5-2.5-18.5 LF-MCG/0.5 IM SUSP: 0.5000 mL | Freq: Once | INTRAMUSCULAR | Status: DC

## 2019-06-01 MED ORDER — OXYCODONE HCL 5 MG PO TABS: 5.0000 mg | ORAL_TABLET | Freq: Once | ORAL | Status: DC | PRN

## 2019-06-01 MED ORDER — MEASLES, MUMPS & RUBELLA VAC IJ SOLR: 0.5000 mL | Freq: Once | INTRAMUSCULAR | Status: DC

## 2019-06-01 MED ORDER — KETOROLAC TROMETHAMINE 30 MG/ML IJ SOLN
30.0000 mg | Freq: Once | INTRAMUSCULAR | Status: AC | PRN
  Administered 2019-06-01: 11:00:00 30 mg via INTRAVENOUS

## 2019-06-01 MED ORDER — WITCH HAZEL-GLYCERIN EX PADS: 1.0000 "application " | MEDICATED_PAD | CUTANEOUS | Status: DC | PRN

## 2019-06-01 MED ORDER — DEXAMETHASONE SODIUM PHOSPHATE 4 MG/ML IJ SOLN
INTRAMUSCULAR | Status: AC
  Filled 2019-06-01: qty 1

## 2019-06-01 MED ORDER — CLINDAMYCIN PHOSPHATE 900 MG/50ML IV SOLN
900.0000 mg | INTRAVENOUS | Status: AC
  Administered 2019-06-01: 900 mg via INTRAVENOUS

## 2019-06-01 MED ORDER — GENTAMICIN SULFATE 40 MG/ML IJ SOLN
5.0000 mg/kg | INTRAVENOUS | Status: AC
  Administered 2019-06-01: 332.4 mg via INTRAVENOUS
  Filled 2019-06-01: qty 8.25

## 2019-06-01 MED ORDER — PHENYLEPHRINE HCL (PRESSORS) 10 MG/ML IV SOLN
INTRAVENOUS | Status: DC | PRN
  Administered 2019-06-01: 80 ug via INTRAVENOUS
  Administered 2019-06-01: 40 ug via INTRAVENOUS
  Administered 2019-06-01: 80 ug via INTRAVENOUS
  Administered 2019-06-01: 40 ug via INTRAVENOUS
  Administered 2019-06-01: 80 ug via INTRAVENOUS

## 2019-06-01 MED ORDER — BUPIVACAINE IN DEXTROSE 0.75-8.25 % IT SOLN
INTRATHECAL | Status: DC | PRN
  Administered 2019-06-01: 1.6 mL via INTRATHECAL

## 2019-06-01 MED ORDER — NALBUPHINE HCL 10 MG/ML IJ SOLN: 5.0000 mg | Freq: Once | INTRAMUSCULAR | Status: DC | PRN

## 2019-06-01 MED ORDER — KETOROLAC TROMETHAMINE 30 MG/ML IJ SOLN
INTRAMUSCULAR | Status: AC
  Filled 2019-06-01: qty 1

## 2019-06-01 MED ORDER — MEPERIDINE HCL 25 MG/ML IJ SOLN: 6.2500 mg | INTRAMUSCULAR | Status: DC | PRN

## 2019-06-01 MED ORDER — ONDANSETRON HCL 4 MG/2ML IJ SOLN
INTRAMUSCULAR | Status: DC | PRN
  Administered 2019-06-01: 4 mg via INTRAVENOUS

## 2019-06-01 MED ORDER — ACETAMINOPHEN 325 MG PO TABS
650.0000 mg | ORAL_TABLET | ORAL | Status: DC | PRN
  Administered 2019-06-01: 17:00:00 650 mg via ORAL
  Filled 2019-06-01: qty 2

## 2019-06-01 MED ORDER — ONDANSETRON HCL 4 MG/2ML IJ SOLN
INTRAMUSCULAR | Status: AC
  Filled 2019-06-01: qty 2

## 2019-06-01 MED ORDER — LACTATED RINGERS IV SOLN
INTRAVENOUS | Status: DC
  Administered 2019-06-01 (×2): via INTRAVENOUS

## 2019-06-01 MED ORDER — FENTANYL CITRATE (PF) 100 MCG/2ML IJ SOLN
INTRAMUSCULAR | Status: DC | PRN
  Administered 2019-06-01: 15 ug via INTRATHECAL

## 2019-06-01 MED ORDER — MENTHOL 3 MG MT LOZG: 1.0000 | LOZENGE | OROMUCOSAL | Status: DC | PRN

## 2019-06-01 MED ORDER — DEXTROSE IN LACTATED RINGERS 5 % IV SOLN
INTRAVENOUS | Status: DC
  Administered 2019-06-01: 20:00:00 via INTRAVENOUS

## 2019-06-01 MED ORDER — OXYCODONE HCL 5 MG/5ML PO SOLN: 5.0000 mg | Freq: Once | ORAL | Status: DC | PRN

## 2019-06-01 MED ORDER — MEDROXYPROGESTERONE ACETATE 150 MG/ML IM SUSP: 150.0000 mg | INTRAMUSCULAR | Status: DC | PRN

## 2019-06-01 MED ORDER — SCOPOLAMINE 1 MG/3DAYS TD PT72
1.0000 | MEDICATED_PATCH | Freq: Once | TRANSDERMAL | Status: AC
  Administered 2019-06-01: 1.5 mg via TRANSDERMAL

## 2019-06-01 MED ORDER — NALOXONE HCL 4 MG/10ML IJ SOLN
1.0000 ug/kg/h | INTRAVENOUS | Status: DC | PRN
  Filled 2019-06-01: qty 5

## 2019-06-01 MED ORDER — NALBUPHINE HCL 10 MG/ML IJ SOLN: 5.0000 mg | INTRAMUSCULAR | Status: DC | PRN

## 2019-06-01 MED ORDER — SIMETHICONE 80 MG PO CHEW
80.0000 mg | CHEWABLE_TABLET | ORAL | Status: DC | PRN
  Administered 2019-06-02: 18:00:00 80 mg via ORAL

## 2019-06-01 MED ORDER — FAMOTIDINE IN NACL 20-0.9 MG/50ML-% IV SOLN
20.0000 mg | Freq: Once | INTRAVENOUS | Status: AC
  Administered 2019-06-01: 20 mg via INTRAVENOUS
  Filled 2019-06-01: qty 50

## 2019-06-01 MED ORDER — MORPHINE SULFATE (PF) 0.5 MG/ML IJ SOLN
INTRAMUSCULAR | Status: DC | PRN
  Administered 2019-06-01: .15 mg via INTRATHECAL

## 2019-06-01 MED ORDER — OXYTOCIN 40 UNITS IN NORMAL SALINE INFUSION - SIMPLE MED: INTRAVENOUS | Status: DC | PRN

## 2019-06-01 MED ORDER — PHENYLEPHRINE HCL-NACL 20-0.9 MG/250ML-% IV SOLN
INTRAVENOUS | Status: DC | PRN
  Administered 2019-06-01: 60 ug/min via INTRAVENOUS

## 2019-06-01 MED ORDER — DEXAMETHASONE SODIUM PHOSPHATE 10 MG/ML IJ SOLN
INTRAMUSCULAR | Status: DC | PRN
  Administered 2019-06-01: 4 mg via INTRAVENOUS

## 2019-06-01 MED ORDER — COCONUT OIL OIL
1.0000 "application " | TOPICAL_OIL | Status: DC | PRN
  Administered 2019-06-02: 1 via TOPICAL

## 2019-06-01 MED ORDER — STERILE WATER FOR IRRIGATION IR SOLN
Status: DC | PRN
  Administered 2019-06-01: 1

## 2019-06-01 MED ORDER — ONDANSETRON HCL 4 MG/2ML IJ SOLN: 4.0000 mg | Freq: Three times a day (TID) | INTRAMUSCULAR | Status: DC | PRN

## 2019-06-01 MED ORDER — SODIUM CHLORIDE 0.9% FLUSH: 3.0000 mL | INTRAVENOUS | Status: DC | PRN

## 2019-06-01 MED ORDER — SENNOSIDES-DOCUSATE SODIUM 8.6-50 MG PO TABS
2.0000 | ORAL_TABLET | ORAL | Status: DC
  Administered 2019-06-01 – 2019-06-03 (×3): 2 via ORAL
  Filled 2019-06-01 (×3): qty 2

## 2019-06-01 MED ORDER — OXYCODONE-ACETAMINOPHEN 5-325 MG PO TABS
1.0000 | ORAL_TABLET | ORAL | Status: DC | PRN
  Administered 2019-06-02: 12:00:00 1 via ORAL
  Administered 2019-06-02 (×2): 2 via ORAL
  Administered 2019-06-03 – 2019-06-04 (×3): 1 via ORAL
  Filled 2019-06-01 (×2): qty 1
  Filled 2019-06-01: qty 2
  Filled 2019-06-01: qty 1
  Filled 2019-06-01: qty 2
  Filled 2019-06-01 (×3): qty 1

## 2019-06-01 MED ORDER — PHENYLEPHRINE HCL-NACL 20-0.9 MG/250ML-% IV SOLN
INTRAVENOUS | Status: AC
  Filled 2019-06-01: qty 250

## 2019-06-01 MED ORDER — SODIUM CHLORIDE 0.9 % IV SOLN
INTRAVENOUS | Status: DC | PRN
  Administered 2019-06-01: 40 [IU] via INTRAVENOUS

## 2019-06-01 MED ORDER — DIPHENHYDRAMINE HCL 50 MG/ML IJ SOLN: 12.5000 mg | INTRAMUSCULAR | Status: DC | PRN

## 2019-06-01 MED ORDER — DIPHENHYDRAMINE HCL 25 MG PO CAPS: 25.0000 mg | ORAL_CAPSULE | Freq: Four times a day (QID) | ORAL | Status: DC | PRN

## 2019-06-01 MED ORDER — DIBUCAINE (PERIANAL) 1 % EX OINT: 1.0000 "application " | TOPICAL_OINTMENT | CUTANEOUS | Status: DC | PRN

## 2019-06-01 MED ORDER — OXYTOCIN 40 UNITS IN NORMAL SALINE INFUSION - SIMPLE MED: 2.5000 [IU]/h | INTRAVENOUS | Status: AC

## 2019-06-01 MED ORDER — PROMETHAZINE HCL 25 MG/ML IJ SOLN: 6.2500 mg | INTRAMUSCULAR | Status: DC | PRN

## 2019-06-01 MED ORDER — SIMETHICONE 80 MG PO CHEW
80.0000 mg | CHEWABLE_TABLET | Freq: Three times a day (TID) | ORAL | Status: DC
  Administered 2019-06-01 – 2019-06-04 (×5): 80 mg via ORAL
  Filled 2019-06-01 (×6): qty 1

## 2019-06-01 MED ORDER — CLINDAMYCIN PHOSPHATE 900 MG/50ML IV SOLN
INTRAVENOUS | Status: AC
  Filled 2019-06-01: qty 50

## 2019-06-01 MED ORDER — SODIUM CHLORIDE 0.9 % IV SOLN
INTRAVENOUS | Status: DC | PRN
  Administered 2019-06-01: 10:00:00 via INTRAVENOUS

## 2019-06-01 MED ORDER — DIPHENHYDRAMINE HCL 25 MG PO CAPS: 25.0000 mg | ORAL_CAPSULE | ORAL | Status: DC | PRN

## 2019-06-01 MED ORDER — SCOPOLAMINE 1 MG/3DAYS TD PT72
MEDICATED_PATCH | TRANSDERMAL | Status: AC
  Filled 2019-06-01: qty 1

## 2019-06-01 MED ORDER — MORPHINE SULFATE (PF) 0.5 MG/ML IJ SOLN
INTRAMUSCULAR | Status: AC
  Filled 2019-06-01: qty 10

## 2019-06-01 MED ORDER — OXYTOCIN 40 UNITS IN NORMAL SALINE INFUSION - SIMPLE MED
INTRAVENOUS | Status: AC
  Filled 2019-06-01: qty 1000

## 2019-06-01 SURGICAL SUPPLY — 35 items
BENZOIN TINCTURE PRP APPL 2/3 (GAUZE/BANDAGES/DRESSINGS) ×3 IMPLANT
CHLORAPREP W/TINT 26ML (MISCELLANEOUS) ×3 IMPLANT
CLAMP CORD UMBIL (MISCELLANEOUS) IMPLANT
CLOSURE STERI STRIP 1/2 X4 (GAUZE/BANDAGES/DRESSINGS) ×2 IMPLANT
CLOSURE WOUND 1/2 X4 (GAUZE/BANDAGES/DRESSINGS)
CLOTH BEACON ORANGE TIMEOUT ST (SAFETY) ×3 IMPLANT
DERMABOND ADVANCED (GAUZE/BANDAGES/DRESSINGS) ×2
DERMABOND ADVANCED .7 DNX12 (GAUZE/BANDAGES/DRESSINGS) IMPLANT
DRSG OPSITE POSTOP 4X10 (GAUZE/BANDAGES/DRESSINGS) ×3 IMPLANT
ELECT REM PT RETURN 9FT ADLT (ELECTROSURGICAL) ×3
ELECTRODE REM PT RTRN 9FT ADLT (ELECTROSURGICAL) ×1 IMPLANT
EXTRACTOR VACUUM KIWI (MISCELLANEOUS) IMPLANT
GLOVE BIO SURGEON STRL SZ 6 (GLOVE) ×3 IMPLANT
GLOVE BIOGEL PI IND STRL 6 (GLOVE) ×2 IMPLANT
GLOVE BIOGEL PI IND STRL 7.0 (GLOVE) ×1 IMPLANT
GLOVE BIOGEL PI INDICATOR 6 (GLOVE) ×4
GLOVE BIOGEL PI INDICATOR 7.0 (GLOVE) ×2
GOWN STRL REUS W/TWL LRG LVL3 (GOWN DISPOSABLE) ×6 IMPLANT
KIT ABG SYR 3ML LUER SLIP (SYRINGE) ×3 IMPLANT
NDL HYPO 25X5/8 SAFETYGLIDE (NEEDLE) ×1 IMPLANT
NEEDLE HYPO 25X5/8 SAFETYGLIDE (NEEDLE) ×3 IMPLANT
NS IRRIG 1000ML POUR BTL (IV SOLUTION) ×3 IMPLANT
PACK C SECTION WH (CUSTOM PROCEDURE TRAY) ×3 IMPLANT
PAD OB MATERNITY 4.3X12.25 (PERSONAL CARE ITEMS) ×3 IMPLANT
PENCIL SMOKE EVAC W/HOLSTER (ELECTROSURGICAL) ×3 IMPLANT
STRIP CLOSURE SKIN 1/2X4 (GAUZE/BANDAGES/DRESSINGS) IMPLANT
SUT CHROMIC 0 CTX 36 (SUTURE) ×9 IMPLANT
SUT MON AB 2-0 CT1 27 (SUTURE) ×3 IMPLANT
SUT PDS AB 0 CT1 27 (SUTURE) IMPLANT
SUT PLAIN 0 NONE (SUTURE) IMPLANT
SUT VIC AB 0 CT1 36 (SUTURE) IMPLANT
SUT VIC AB 4-0 KS 27 (SUTURE) IMPLANT
TOWEL OR 17X24 6PK STRL BLUE (TOWEL DISPOSABLE) ×3 IMPLANT
TRAY FOLEY W/BAG SLVR 14FR LF (SET/KITS/TRAYS/PACK) IMPLANT
WATER STERILE IRR 1000ML POUR (IV SOLUTION) ×3 IMPLANT

## 2019-06-01 NOTE — MAU Note (Signed)
Anesthesiologist and CRNA notified of c/s.

## 2019-06-01 NOTE — Anesthesia Procedure Notes (Signed)
Spinal  Patient location during procedure: OB Start time: 06/01/2019 9:07 AM End time: 06/01/2019 9:12 AM Staffing Anesthesiologist: Lynda Rainwater, MD Performed: anesthesiologist  Preanesthetic Checklist Completed: patient identified, surgical consent, pre-op evaluation, timeout performed, IV checked, risks and benefits discussed and monitors and equipment checked Spinal Block Patient position: sitting Prep: site prepped and draped and DuraPrep Patient monitoring: heart rate, cardiac monitor, continuous pulse ox and blood pressure Approach: midline Location: L3-4 Injection technique: single-shot Needle Needle type: Pencan  Needle gauge: 24 G Needle length: 10 cm Assessment Sensory level: T4

## 2019-06-01 NOTE — Anesthesia Preprocedure Evaluation (Signed)
Anesthesia Evaluation  Patient identified by MRN, date of birth, ID band Patient awake    Reviewed: Allergy & Precautions, NPO status , Patient's Chart, lab work & pertinent test results  Airway Mallampati: II  TM Distance: >3 FB Neck ROM: Full    Dental no notable dental hx.    Pulmonary neg pulmonary ROS,    Pulmonary exam normal breath sounds clear to auscultation       Cardiovascular negative cardio ROS Normal cardiovascular exam Rhythm:Regular Rate:Normal     Neuro/Psych negative neurological ROS  negative psych ROS   GI/Hepatic negative GI ROS, Neg liver ROS,   Endo/Other  negative endocrine ROS  Renal/GU negative Renal ROS  negative genitourinary   Musculoskeletal negative musculoskeletal ROS (+)   Abdominal   Peds negative pediatric ROS (+)  Hematology negative hematology ROS (+)   Anesthesia Other Findings   Reproductive/Obstetrics (+) Pregnancy                             Anesthesia Physical Anesthesia Plan  ASA: II  Anesthesia Plan: Spinal   Post-op Pain Management:    Induction:   PONV Risk Score and Plan: 2 and Ondansetron, Midazolam and Treatment may vary due to age or medical condition  Airway Management Planned: Natural Airway  Additional Equipment:   Intra-op Plan:   Post-operative Plan:   Informed Consent: I have reviewed the patients History and Physical, chart, labs and discussed the procedure including the risks, benefits and alternatives for the proposed anesthesia with the patient or authorized representative who has indicated his/her understanding and acceptance.     Dental advisory given  Plan Discussed with: CRNA  Anesthesia Plan Comments:         Anesthesia Quick Evaluation  

## 2019-06-01 NOTE — MAU Note (Signed)
PT SAYS AT [facility]. [place]- DR  MORRIS . YESTERDAY - CLOSED IN OFFICE.  DENIES HSV AND MRSA. GBS- UNSURE.  NO UC'S.

## 2019-06-01 NOTE — Transfer of Care (Signed)
Immediate Anesthesia Transfer of Care Note  Patient: Rachel Wright  Procedure(s) Performed: CESAREAN SECTION (N/A )  Patient Location: PACU  Anesthesia Type:Spinal  Level of Consciousness: awake, alert  and patient cooperative  Airway & Oxygen Therapy: Patient Spontanous Breathing  Post-op Assessment: Report given to RN and Post -op Vital signs reviewed and stable  Post vital signs: Reviewed and stable  Last Vitals:  Vitals Value Taken Time  BP 100/58 06/01/19 1005  Temp 36.6 C 06/01/19 1001  Pulse 93 06/01/19 1006  Resp 16 06/01/19 1006  SpO2 98 % 06/01/19 1006  Vitals shown include unvalidated device data.  Last Pain:  Vitals:   06/01/19 1001  TempSrc: Oral         Complications: No apparent anesthesia complications

## 2019-06-01 NOTE — Op Note (Signed)
Cesarean Section Procedure Note   Rachel Wright  06/01/2019  Indications: Breech Presentation   Pre-operative Diagnosis: breech and SROM.   Post-operative Diagnosis: Same   Surgeon: Juliann Mule) and Role:    Marylynn Pearson, MD - Primary   Assistants: none  Anesthesia: spinal   Procedure Details:  The patient was seen in the Holding Room. The risks, benefits, complications, treatment options, and expected outcomes were discussed with the patient. The patient concurred with the proposed plan, giving informed consent. identified as [name] and the procedure verified as C-Section Delivery. A Time Out was held and the above information confirmed.  After induction of anesthesia, the patient was draped and prepped in the usual sterile manner. A transverse was made and carried down through the subcutaneous tissue to the fascia. Fascial incision was made and extended transversely. The fascia was separated from the underlying rectus tissue superiorly and inferiorly. The peritoneum was identified and entered. Peritoneal incision was extended longitudinally. The utero-vesical peritoneal reflection was incised transversely and the bladder flap was bluntly freed from the lower uterine segment. A low transverse uterine incision was made. Delivered from breech  presentation was a viable female infant. Cord ph was not sent the umbilical cord was clamped and cut cord blood was obtained for evaluation. The placenta was removed Intact and appeared normal. The uterine outline, tubes and ovaries appeared normal}. The uterine incision was closed with running locked sutures of 0chromic gut.   Hemostasis was observed. Lavage was carried out until clear. The fascia was then reapproximated with running sutures of 0Vicryl.  The skin was closed with 4-0Vicryl.   Instrument, sponge, and needle counts were correct prior the abdominal closure and were correct at the conclusion of the case.     Findings:   Estimated Blood Loss: pending final evaluation  Urine Output: clear  Specimens: none  Complications: no complications  Disposition: PACU - hemodynamically stable.   Maternal Condition: stable   Baby condition / location:  Couplet care / Skin to Skin  Attending Attestation: I was present and scrubbed for the entire procedure.   Signed: Surgeon(s): Marylynn Pearson, MD

## 2019-06-01 NOTE — H&P (Addendum)
Rachel Wright is a 33 y.o. female G1 at 3255w5d with PROM at [id number] today.  Patient is breech and had failed ECV on 6/11.  When patient presented for ECV, T&S resulted with Anti E Ab; no titers.  The patient had titers drawn in our office but they have not yet resulted.  Antibody screen drawn with prenatal panel was negative.  Pregnancy product of IVF (donor egg).  GBS negative.  No CTX/pain.  Last po 9 pm last night.  OB History    Gravida  1   Para      Term      Preterm      AB      Living        SAB      TAB      Ectopic      Multiple      Live Births             Past Medical History:  Diagnosis Date  . Chronic kidney disease    kidney stones  . Newborn product of in vitro fertilization (IVF) pregnancy   . Vaginal Pap smear, abnormal    Past Surgical History:  Procedure Laterality Date  . BREAST SURGERY     lumpectomy  . TONSILLECTOMY     Family History: family history includes Anxiety disorder in her mother; Breast cancer in her maternal grandmother and mother; Cervical cancer in her mother; Deep vein thrombosis in her mother; Depression in her mother; Heart disease in her mother; Hypertension in her mother. Social History:  reports that she has never smoked. She has never used smokeless tobacco. She reports that she does not drink alcohol or use drugs.     Maternal Diabetes: No Genetic Screening: Normal Maternal Ultrasounds/Referrals: Normal Fetal Ultrasounds or other Referrals:  None Maternal Substance Abuse:  No Significant Maternal Medications:  None Significant Maternal Lab Results:  Group B Strep negative and Other: Anti E Ab. Other Comments:  None  ROS Maternal Medical History:  Reason for admission: Rupture of membranes.   Contractions: Onset was 3-5 hours ago.   Frequency: rare.   Perceived severity is mild.    Fetal activity: Perceived fetal activity is normal.   Last perceived fetal movement was within the past hour.    Prenatal  Complications - Diabetes: none.      Blood pressure (!) 133/96, pulse (!) 106, temperature 98.3 F (36.8 C), temperature source Oral, resp. rate 18, height 5\' 4"  (1.626 m), weight 84.1 kg, SpO2 99 %. Maternal Exam:  Uterine Assessment: Contraction strength is mild.  Contraction frequency is rare.   Abdomen: Patient reports no abdominal tenderness. Fundal height is c/w dates.   Estimated fetal weight is 6#12.   Fetal presentation: breech     Fetal Exam Fetal Monitor Review: Mode: ultrasound.   Baseline rate: 145.  Variability: moderate (6-25 bpm).   Pattern: accelerations present and no decelerations.    Fetal State Assessment: Category I - tracings are normal.     Physical Exam  Constitutional: She is oriented to person, place, and time. She appears well-developed and well-nourished.  GI: Soft. There is no rebound and no guarding.  Neurological: She is alert and oriented to person, place, and time.  Skin: Skin is warm and dry.  Psychiatric: She has a normal mood and affect. Her behavior is normal.    Prenatal labs: ABO, Rh: --/--/PENDING (06/16 [id number]) Antibody: PENDING (06/16 0525) Rubella: Immune (12/04 0000) RPR: Nonreactive (12/04 0000)  HBsAg: Negative (12/04 0000)  HIV: Non-reactive (12/04 0000)  GBS:   Negative  Assessment/Plan: 33yo G1 at [redacted]w[redacted]d with PROM, breech presentation. -Bedside u/s confirms breech presentation -Patient is counseled re: risk of bleeding, infection, scarring, and damage to surrounding structures.  She is counseled re: 1% risk of uterine rupture in subsequent pregnancies.  All questions were answered and we will proceed with primary C/S. -Anti E Ab-Per lab, titers are send out labs.  They will not be resulted prior to delivery.  Rachel Wright 06/01/2019, 6:00 AM

## 2019-06-01 NOTE — MAU Note (Signed)
ALSO  BREECH - NOT [place] YET.

## 2019-06-01 NOTE — Anesthesia Postprocedure Evaluation (Signed)
Anesthesia Post Note  Patient: KRYSTOL ROCCO  Procedure(s) Performed: CESAREAN SECTION (N/A )     Patient location during evaluation: PACU Anesthesia Type: Spinal Level of consciousness: oriented and awake and alert Pain management: pain level controlled Vital Signs Assessment: post-procedure vital signs reviewed and stable Respiratory status: spontaneous breathing and respiratory function stable Cardiovascular status: blood pressure returned to baseline and stable Postop Assessment: no headache, no backache and no apparent nausea or vomiting Anesthetic complications: no    Last Vitals:  Vitals:   06/01/19 1127 06/01/19 1134  BP: 137/88 128/86  Pulse: 81 74  Resp: 18   Temp:    SpO2: 97% 97%    Last Pain:  Vitals:   06/01/19 1115  TempSrc: Oral  PainSc: 0-No pain   Pain Goal:                Epidural/Spinal Function Cutaneous sensation: Tingles (06/01/19 1115), Patient able to flex knees: Yes (06/01/19 1115), Patient able to lift hips off bed: No (06/01/19 1115), Back pain beyond tenderness at insertion site: No (06/01/19 1115), Progressively worsening motor and/or sensory loss: No (06/01/19 1115), Bowel and/or bladder incontinence post epidural: No (06/01/19 1115)  Lynda Rainwater

## 2019-06-02 ENCOUNTER — Encounter (HOSPITAL_COMMUNITY): Payer: Self-pay | Admitting: *Deleted

## 2019-06-02 LAB — CBC
HCT: 32.2 % — ABNORMAL LOW (ref 36.0–46.0)
Hemoglobin: 10.6 g/dL — ABNORMAL LOW (ref 12.0–15.0)
MCH: 29.7 pg (ref 26.0–34.0)
MCHC: 32.9 g/dL (ref 30.0–36.0)
MCV: 90.2 fL (ref 80.0–100.0)
Platelets: 236 10*3/uL (ref 150–400)
RBC: 3.57 MIL/uL — ABNORMAL LOW (ref 3.87–5.11)
RDW: 12.8 % (ref 11.5–15.5)
WBC: 10 10*3/uL (ref 4.0–10.5)
nRBC: 0 % (ref 0.0–0.2)

## 2019-06-02 NOTE — Plan of Care (Signed)
  Problem: Coping: Goal: Ability to identify and utilize available resources and services will improve Outcome: Completed/Met

## 2019-06-02 NOTE — Lactation Note (Signed)
This note was copied from a baby's chart. Lactation Consultation Note  Patient Name: Rachel Wright EAVWU'J Date: 06/02/2019 Reason for consult: 1st time breastfeeding;Early term 37-38.6wks P1, 54 hour female infant, mom IVF/ C/S delivery and left breast is pseudo inverted/.  Mom brought DEBP from home, will plan to pump later today. Infant had 2 voids and 2 stools since delivery. LC discussed hand expression but colostrum is not present at this time. Per mom, infant is latching well to breast 30 mins in L&D and breastfeed 4 times since birth. Luna notice mom has small abrasion on left breast and right breast is pseudo inverted mom doesn't not need NS.  Red Bud notice mom has door knob shaped breast.  Nurse gave mom coconut  oil to help with healing  Mom latched infant on left breast using cross cradle hold position infant would suckle few times then stop not consistent with rthymitic sucking pattern.  Mom was still breastfeeding when [facility] left the room.  Mom knows to breastfeed infant according to hunger cues, 8 to 12 times within 24 hours on demand. Mom knows to call Nurse or Abrams if she has amy questions, concerns or need assistance with latching infant to breast.  LC discussed I & O. Mom made aware of O/P services, breastfeeding support groups, community resources, and our phone # for post-discharge questions.   Maternal Data Formula Feeding for Exclusion: No Has patient been taught Hand Expression?: Yes  Feeding Feeding Type: Breast Fed  LATCH Score Latch: Repeated attempts needed to sustain latch, nipple held in mouth throughout feeding, stimulation needed to elicit sucking reflex.  Audible Swallowing: A few with stimulation  Type of Nipple: Inverted  Comfort (Breast/Nipple): Filling, red/small blisters or bruises, mild/mod discomfort  Hold (Positioning): Assistance needed to correctly position infant at breast and maintain latch.  LATCH Score:  4  Interventions Interventions: Breast feeding basics reviewed;Skin to skin;Position options;Support pillows;Adjust position;Breast compression;Coconut oil;Hand express;Breast massage  Lactation Tools Discussed/Used WIC Program: No   Consult Status Consult Status: Follow-up Date: 06/02/19 Follow-up type: In-patient    [name] 06/02/2019, 4:36 AM

## 2019-06-02 NOTE — Progress Notes (Signed)
Patient doing well BP 112/80 (BP Location: Right Arm)   Pulse 69   Temp 98.5 F (36.9 C) (Oral)   Resp 18   Ht 5\' 4"  (1.626 m)   Wt 84.1 kg   SpO2 98%   Breastfeeding Unknown   BMI 31.84 kg/m  Results for orders placed or performed during the hospital encounter of 06/01/19 (from the past 24 hour(s))  CBC     Status: Abnormal   Collection Time: 06/02/19  5:27 AM  Result Value Ref Range   WBC 10.0 4.0 - 10.5 K/uL   RBC 3.57 (L) 3.87 - 5.11 MIL/uL   Hemoglobin 10.6 (L) 12.0 - 15.0 g/dL   HCT 32.2 (L) 36.0 - 46.0 %   MCV 90.2 80.0 - 100.0 fL   MCH 29.7 26.0 - 34.0 pg   MCHC 32.9 30.0 - 36.0 g/dL   RDW 12.8 11.5 - 15.5 %   Platelets 236 150 - 400 K/uL   nRBC 0.0 0.0 - 0.2 %   Abdomen is soft and non tender  POD # 1  Doing well Discharge home tomorrow

## 2019-06-02 NOTE — Lactation Note (Signed)
This note was copied from a baby's chart. Lactation Consultation Note  Patient Name: Rachel Wright Date: 06/02/2019  P1, 69 hour female infant. LC entered room mom and infant asleep.   Maternal Data    Feeding Feeding Type: Breast Fed  LATCH Score                   Interventions    Lactation Tools Discussed/Used     Consult Status      [name] 06/02/2019, 1:02 AM

## 2019-06-03 NOTE — Progress Notes (Signed)
Subjective: Postpartum Day 2: Cesarean Delivery Patient reports incisional pain, tolerating PO, + flatus and no problems voiding.    Objective: Vital signs in last 24 hours: Temp:  [98.2 F (36.8 C)-98.6 F (37 C)] 98.5 F (36.9 C) (06/18 0500) Pulse Rate:  [67-74] 67 (06/18 0500) Resp:  [14-16] 16 (06/18 0500) BP: (121-127)/(79-87) 127/87 (06/18 0500) SpO2:  [98 %] 98 % (06/17 2209)  Physical Exam:  General: alert, cooperative, appears stated age and no distress Lochia: appropriate Uterine Fundus: firm Incision: healing well DVT Evaluation: No evidence of DVT seen on physical exam.  Recent Labs    06/01/19 0525 06/02/19 0527  HGB 11.7* 10.6*  HCT 35.5* 32.2*    Assessment/Plan: Status post Cesarean section. Doing well postoperatively.  Plan d/c tomorrow.  Luz Lex 06/03/2019, 9:21 AM

## 2019-06-03 NOTE — Lactation Note (Signed)
This note was copied from a baby's chart. Lactation Consultation Note  Patient Name: Rachel Wright [id number] Date: 06/03/2019 Reason for consult: Follow-up assessment;Hyperbilirubinemia;Early term 37-38.6wks;Primapara;1st time breastfeeding(RN request)  1648 - 1730 - I followed up with Ms. Drumheller upon RN request. Randel Books was crying upon entry. We discussed baby's elevated bilirubin levels and supplementation was recommended. I povided Ms. Vardaman supplementation guidelines.  Ms. Benecke latched her daugther, and I fed a 5 french feeding tube device with formula through to her breast. Baby had rhythmic suckling sequences and was able to pull the milk through the feeding tube with occasional nudges.   I stayed with Ms. Carmickle for about 20 additional minutes to observe feeding. I gave Ms. Oettinger a new syringe with an additional seven mls. Baby was becoming sleepy after about 8 mls.  RN entered the room during the visit and also provided some education. We discussed providing baby about 20 mls. We are awaiting results from serum bilirubin test.   I encouraged Ms. Vaca to continue to breast feed baby 8-12 times a day supplementing and pumping following. Her RN set up a DEBP, and I encouraged her to pump every three hours and to provide any EBM back to baby. I also left a curved tip syringe with her and a bottle nipple (similac slow flow).    Maternal Data Formula Feeding for Exclusion: No Has patient been taught Hand Expression?: Yes Does the patient have breastfeeding experience prior to this delivery?: No  Feeding Feeding Type: Breast Milk with Formula added  LATCH Score Latch: Grasps breast easily, tongue down, lips flanged, rhythmical sucking.  Audible Swallowing: A few with stimulation  Type of Nipple: Everted at rest and after stimulation  Comfort (Breast/Nipple): Soft / non-tender  Hold (Positioning): Assistance needed to correctly position infant at breast  and maintain latch.  LATCH Score: 8  Interventions Interventions: Breast feeding basics reviewed(5 french feeding tube and syringe)  Lactation Tools Discussed/Used Tools: 65F feeding tube / Syringe(formula) Pump Review: Setup, frequency, and cleaning Initiated by:: BDaly, RN Date initiated:: 06/03/19   Consult Status Consult Status: Follow-up Date: 06/04/19 Follow-up type: In-patient    Lenore Manner 06/03/2019, 7:14 PM

## 2019-06-03 NOTE — Discharge Summary (Signed)
Patient arrived NPO for ECV. bedside ultrasound was performed which confirmed the single intrauterine pregnancy with frank breech presentation. There was noted to be adequate fluid. The vertex was noted in RUQ. Using manual pressure, the fetus was manipulated with gentle pressure from the palms against the buttock and posterior occupit to stimulate a backward roll, carefully avoiding cord insertion. In total,3 attempts where made. Fetal HRs were obtained between each attempt and were reassuring, between 125-135 after each attempt. On the final attempt, the fetus shifted to Vertex being slightly low but remained breech. Following the procedure, Cord insertion was evaluated with good blood flow. she was noted to have a reassuring and reactive tracing for 1 hour post procedure. She did not have any regular contractions and there were no signs of PROM. She was discharged home with instructions on reasons to return to L&D and otherwise she will follow up in clinic in one week.

## 2019-06-04 ENCOUNTER — Encounter (HOSPITAL_COMMUNITY): Payer: Self-pay | Admitting: *Deleted

## 2019-06-04 MED ORDER — IBUPROFEN 600 MG PO TABS: 600.0000 mg | ORAL_TABLET | Freq: Four times a day (QID) | ORAL | 0 refills | Status: DC

## 2019-06-04 MED ORDER — OXYCODONE-ACETAMINOPHEN 5-325 MG PO TABS: 1.0000 | ORAL_TABLET | ORAL | 0 refills | Status: DC | PRN

## 2019-06-04 NOTE — Discharge Instructions (Signed)
Call MD for T>100.4, heavy vaginal bleeding, severe abdominal pain, intractable nausea and/or vomiting, or respiratory distress.  Call office to schedule postop incision check in 1 weeks.  Pelvic rest x 6 weeks.  No driving while taking narcotics.

## 2019-06-04 NOTE — Lactation Note (Signed)
This note was copied from a baby's chart. Lactation Consultation Note  Patient Name: Rachel Wright [id number] Date: 06/04/2019 Reason for consult: Follow-up assessment   Baby 27 hours old and recently bf for 10 min.  Now sleepy in crib. Mother is breastfeeding and supplementing with formula. Mother states baby latches well.  Discussed frequency and breastfeeding on both breasts per session as much as possible.   Feed on demand approximately 8-12 times per day.   Reviewed engorgement care and monitoring voids/stools. Mother has DEBP at home.  Encouraged pumping q 3 hr if supplementing with formula.    Maternal Data    Feeding Feeding Type: Breast Fed  LATCH Score Latch: Grasps breast easily, tongue down, lips flanged, rhythmical sucking.  Audible Swallowing: Spontaneous and intermittent  Type of Nipple: Everted at rest and after stimulation  Comfort (Breast/Nipple): Soft / non-tender  Hold (Positioning): No assistance needed to correctly position infant at breast.  LATCH Score: 10  Interventions Interventions: Breast feeding basics reviewed;DEBP  Lactation Tools Discussed/Used     Consult Status Consult Status: Complete Date: 06/04/19    Vivianne Master [facility] 06/04/2019, 10:06 AM

## 2019-06-04 NOTE — Discharge Summary (Signed)
Obstetric Discharge Summary Reason for Admission: rupture of membranes, breech presentation Prenatal Procedures: none Intrapartum Procedures: cesarean: low cervical, transverse Postpartum Procedures: none Complications-Operative and Postpartum: none Hemoglobin  Date Value Ref Range Status  06/02/2019 10.6 (L) 12.0 - 15.0 g/dL Final   HCT  Date Value Ref Range Status  06/02/2019 32.2 (L) 36.0 - 46.0 % Final    Physical Exam:  General: alert, cooperative and appears stated age 75: appropriate Uterine Fundus: firm Incision: healing well, no significant drainage, no dehiscence DVT Evaluation: No evidence of DVT seen on physical exam. Negative Homan's sign. No cords or calf tenderness.  Discharge Diagnoses: Term Pregnancy-delivered  Discharge Information: Date: 06/04/2019 Activity: pelvic rest Diet: routine Medications: PNV, Ibuprofen and Percocet Condition: stable Instructions: refer to practice specific booklet Discharge to: home   Newborn Data: Live born female  Birth Weight: 6 lb 11.4 oz (3045 g) APGAR: 68, 9  Newborn Delivery   Birth date/time: 06/01/2019 09:29:00 Delivery type: C-Section, Low Transverse Trial of labor: No C-section categorization: Primary      Home with mother.  Linda Hedges 06/04/2019, 9:02 AM

## 2019-06-05 LAB — TYPE AND SCREEN
ABO/RH(D): A POS
Antibody Screen: POSITIVE
Donor AG Type: NEGATIVE
Donor AG Type: NEGATIVE
Unit division: 0
Unit division: 0

## 2019-06-05 LAB — BPAM RBC
Blood Product Expiration Date: 202007032359
Blood Product Expiration Date: 202007032359
Unit Type and Rh: 5100
Unit Type and Rh: 5100

## 2019-06-10 ENCOUNTER — Inpatient Hospital Stay (HOSPITAL_COMMUNITY): Admit: 2019-06-10 | Payer: BC Managed Care – PPO | Admitting: Obstetrics & Gynecology

## 2019-07-23 ENCOUNTER — Other Ambulatory Visit: Payer: Self-pay | Admitting: Obstetrics & Gynecology

## 2019-07-23 ENCOUNTER — Ambulatory Visit: Payer: Self-pay

## 2019-07-23 DIAGNOSIS — E049 Nontoxic goiter, unspecified: Secondary | ICD-10-CM

## 2019-07-23 NOTE — Lactation Note (Signed)
This note was copied from a baby's chart. Lactation Consultation Note  Patient Name: Rachel Wright [id number] Date: 07/23/2019     07/23/2019  Name: Aseneth Hack MRN: [id number] Date of Birth: 06/01/2019 Gestational Age: Gestational Age: [redacted]w[redacted]d Birth Weight: 107.4 oz Weight today:    9 pounds 12.4 ounces (4434 grams) with clean size 60 diaper  56 week old infant presents today with mom for feeding assessment. Infant latched in the hospital and was supplemented due to weight loss. Infant then went home bottle feeding. Mom would like for infant to breast feed. Mom has been trying to latch infant to the breast the last few days and does not feel like infant has a deep latch.   Mom has been pumping every 2-4 hours and gets about 1-2 ounces per pumping. Infant is supplemented with formula. Infant 70% formula and 30% EBM via bottle.   Infant with some upper lip tightness. Infant with sucking blister to top center lip. Infant clicks on the bottle, not on the breast. Infant with good tongue mobility. Nipples without pain and rounded post feeding.   Infant latched to both breasts easily and fed off and on. We applied the 5 french feeding tube and fed actively at the breast. Nipples rounded post feeding, no pain noted with feeding.   Mom reports infant IVF due to maternal age. She reports that her hormone levels were all ok. Mom reports she saw her OB yesterday and was told her Thyroid is enlarged. She is awaiting lab results and an [place]. Mom has had surgery to right breast at about the 3-4 o'clock position.   Reviewed Fenugreek, Mother Love More Milk Special Blend and Legendairy Milk Fenugreek Free herbal supplements. Discussed she should talk with OB prior to taking  Discussed diet, fluids, oatmeal, electrolyte drinks and continued frequent pumping to protect and promote milk supply.    Infant to follow up with Dr Maisie Fus August 25. Infant to follow up with Lactation in 1-2  weeks.   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 Saul Fordyce with formula) Brand: Similac Formula volume: 3-4 ounces Formula frequency: every 2-4 hours   Breast milk volume: 3-4 ounces Breast milk frequency: every 3 hours   Pump type: Spectra Pump frequency: every 2-3 hours Pump volume: 2 ounces  Infant Output Assessment: Voids per 24 hours: 7-10 Urine color: Clear yellow Stools per 24 hours: 1-2 Stool color: Green  Breast Assessment: Breast: Soft, Compressible, Scars Nipple: Erect Pain level: 0 Pain interventions: Bra, Breast pump  Feeding Assessment: Infant oral assessment: Variance Infant oral assessment comment: See note Positioning: Football(right breast, 15 minutes) Latch: 2 - Grasps breast easily, tongue down, lips flanged, rhythmical sucking. Audible swallowing: 2 - Spontaneous and intermittent Type of nipple: 2 - Everted at rest and after stimulation Comfort: 2 - Soft/non-tender Hold: 1 - Assistance needed to correctly position infant at breast and maintain latch LATCH score: 9 Latch assessment: Deep Lips flanged: Yes Suck assessment: Displays both Tools: Syringe with 5 Fr feeding tube Pre-feed weight: 4434 grams Post feed weight: 4458  grams Amount transferred: 0 Amount supplemented: 24 ml  Additional Feeding Assessment: Infant oral assessment: Variance Infant oral assessment comment: see note Positioning: Football(left  breast, 15 minutes) Latch: 2 - Grasps breast easily, tongue down, lips flanged, rhythmical sucking. Audible swallowing: 2 - Spontaneous and intermittent Type of nipple: 2 - Everted at rest and after stimulation Comfort: 2 - Soft/non-tender Hold: 1 - Assistance needed to correctly position infant at breast and maintain latch LATCH score: 9 Latch assessment: Deep Lips flanged: Yes Suck assessment: Displays both Tools: Syringe with 5 Fr feeding tube Pre-feed weight: 4458 grams Post feed weight: 4490 grams Amount transferred: 22 ml Amount supplemented: 10 ml via 5 french feeding tube and 15 cc via bottle  Totals: Total amount transferred: 22 ml Total supplement given: 50 ml Total amount pumped post feed: did not pump   Plan:   1. Offer infant the breast with feeding cues as mom and infant want 2. Practice skin to skin with infant as much as you can 3. Baby wearing can be very helpful 4. Offer both breasts with each feeding 5. Can try the 5 french feeding tube at the breast to supplement infant. Can use breast milk or formula to supplement with.  6. Offer infant a bottle of pumped breast milk or formula after breast feeding if she is still cueing to feed 7. Infant needs about 83-110 ml (3-4 ounces) for 8 feedings a day or 660-880 ml (22-29 ounces) in 24 hours. Infant may take more or less depending on how often she feeds. Feed infant until she is satisfied.  8. Continue the paced bottle feeding that you are doing 9. Would recommend that you continue pumping about 8 x a day to protect and promote milk supply 10. Keep up the good work 11. Thank you for allowing me to assist you today 12. Please call with questions/concerns as needed [phone] 13. Follow up with Lactation in 1-2 weeks    Ed BlalockSharon S Mariabella Nilsen RN, IBCLC                                                      Silas FloodSharon S Kemauri Musa 07/23/2019, 10:30 AM

## 2019-07-29 ENCOUNTER — Ambulatory Visit: Payer: Self-pay

## 2019-07-29 NOTE — Lactation Note (Signed)
This note was copied from a baby's chart. Lactation Consultation Note  Patient Name: Rachel Wright [id number] Date: 07/29/2019     07/29/2019  Name: Meghna Hagmann MRN: [id number] Date of Birth: 06/01/2019 Gestational Age: Gestational Age: [redacted]w[redacted]d Birth Weight: 107.4 oz Weight today:    9 pounds 5.4 ounces (4236 grams) with clean newborn diaper  57 week old infant presents today with mom for follow up feeding assessment. Infant ate not long before appointment.   Infant has lost 198 grams in the last 6 days. Mom reports infant was sick earlier in the week and was willing to breast feed but would not bottle feed. Infant was taken to peds office and they recommended making sure infant had the bottle for supplement and mom is doing so. Infant getting mostly formula in the last few days per mom.   Infant is feeding every 3 hours at the breast and is taking 1-2 ounces in a bottle afterwards. Mom reports infant sleeps about 6-6.5 hours at night. Enc mom to wake infant up at 4 hours at night to feed her until she is gaining weight again. Mom has used the # 5 french feeding tube at the breast and reports infant stays on the breast better.   Sometimes infant will latch and other times she will not. Infant will sometimes take a bottle and sometimes will not. Infant has been latching for shorter periods since not feeling well.   Infant latched to the breast in the cross cradle hold. She fed for short periods and fell asleep. Infant did better on the right breast than on the left breast. Infant sleepy at the breast today. Infant fussy off and on. She latches easily but does not sustain suckling. Infant ate 1 ounce of formula with the bottle and then refused any more.   Infant to follow up with Dr. Maisie Fus on August 25. Infant to follow up with Lactation in 1 week.       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) Maternal medications: Pre-natal vitamin  Breastfeeding History: Frequency of breast feeding: every 2-3 hours during the day Duration of feeding: 10-20 minutes  Supplementation: Supplement method: bottle   Formula volume: 1-3 ounces per feeding Formula frequency: 30 % of feedings   Breast milk volume: 1-3 ounces Breast milk frequency: every 1-3 ounces 70% of feeding amount   Pump type: Spectra Pump frequency: every 2-3 hours with some 4 hour stretches at night Pump volume: 1-2 ounces  Infant Output Assessment: Voids per 24 hours: 8+ Urine color: Clear yellow Stools per 24 hours: 4-5 Stool color: Yellow  Breast Assessment: Breast: Soft, Compressible Nipple: Erect Pain level: 2(with initial latch then improves with feeding on left breast only) Pain interventions: Bra, Breast pump  Feeding Assessment: Infant oral assessment: Variance Infant oral assessment comment: Infant with thick labial frenullum to upper lip. Upper lip flanges pretty well. Infant with sucking blister to top upper lip. infant with strong suckle with good tongue mobility. Nipple rounded post feeding with some pain with initial latch. infant clicking some on the breast, not consistently Positioning: Cross cradle(left breast, 5 minutes) Latch: 1 - Repeated attempts needed to sustain latch, nipple held in mouth throughout feeding, stimulation needed to elicit sucking reflex. Audible swallowing: 1 -  A few with stimulation Type of nipple: 2 - Everted at rest and after stimulation Comfort: 1 - Filling, red/small blisters or bruises, mild/mod discomfort Hold: 2 - No assistance needed to correctly position infant at breast LATCH score: 7 Latch assessment: Deep Lips flanged:  Yes Suck assessment: Displays both   Pre-feed weight: 4236 grams Post feed weight: 4240 grams Amount transferred: 4 ml Amount supplemented: 0  Additional Feeding Assessment: Infant oral assessment: Variance Infant oral assessment comment: see above Positioning: Cross cradle(right breast, 15 minutes) Latch: 1 - Repeated attempts neede to sustain latch, nipple held in mouth throughout feeding, stimulation needed to elicit sucking reflex. Audible swallowing: 1 - A few with stimulation Type of nipple: 2 - Everted at rest and after stimulation Comfort: 2 - Soft/non-tender Hold: 2 - No assistance needed to correctly position infant at breast LATCH score: 8 Latch assessment: Deep Lips flanged: Yes Suck assessment: Displays both Tools: Bottle Pre-feed weight: 4222 grams Post feed weight: 4238 grams Amount transferred: 16 ml    Totals: Total amount transferred: 20 ml Total supplement given: 30 ml Formula via Dr. Theora GianottiBrown's Bottle Total amount pumped post feed: did not pump  1. Offer infant the breast with feeding cues as mom and infant want 2. Practice skin to skin with infant as much as you can 3. Baby wearing can be very helpful 4. Offer both breasts with each feeding 5. Can try the 5 french feeding tube at the breast to supplement infant. Can use breast milk or formula to supplement with.  6. Offer infant a bottle of pumped breast milk or formula after each breast feeding  7. Infant needs about 79-105 ml 2.5-3.5 ounces) for 8 feedings a day or 630-840 ml (21-28 ounces) in 24 hours. Infant may take more or less depending on how often she feeds. Feed infant until she is satisfied.  8. Continue the paced bottle feeding that you are doing 9. Would recommend that you continue pumping about 8 x a day to protect and promote milk supply 10. Keep up the good work 11. Thank you for allowing me to assist you today 12. Please call with questions/concerns as needed [phone] 13. Follow up  with Lactation in 1 week   Ed BlalockSharon S Kiele Heavrin RN, IBCLC                                                    Ed BlalockSharon S Pansey Pinheiro 07/29/2019, 1:27 PM

## 2019-08-02 ENCOUNTER — Ambulatory Visit
Admission: RE | Admit: 2019-08-02 | Discharge: 2019-08-02 | Disposition: A | Discharge status: Home / Self Care | Payer: BC Managed Care – PPO | Source: Ambulatory Visit | Attending: Obstetrics & Gynecology | Admitting: Obstetrics & Gynecology

## 2019-08-02 DIAGNOSIS — E049 Nontoxic goiter, unspecified: Secondary | ICD-10-CM

## 2019-08-17 ENCOUNTER — Ambulatory Visit: Payer: Self-pay

## 2019-08-17 NOTE — Lactation Note (Signed)
This note was copied from a baby's chart. Lactation Consultation Note  Patient Name: Rachel Wright SNKNL'Z Date: 08/17/2019     08/17/2019  Name: Shinita Mac MRN: [id number] Date of Birth: 06/01/2019 Gestational Age: Gestational Age: [redacted]w[redacted]d Birth Weight: 107.4 oz Weight today:    10 pounds 4.3 ounces (4660 grams) with clean size 32 diaper  38 month old infant presents today with mom for feeding assessment. Infant has been in the hospital for fever and diarrhea.   Infant has gained 424 grams in the last 19 days with an average daily weight gain of 22 grams a day. Infant has gained a pound since 8/25.   Infant is being fed EleCare formula and is being BF once a day per GI. Pt has had blood work drawn to determine if she has a Milk Protein allergy. Mom has been cutting dairy out of her diet and she seems to be tolerating BF once a day.   Mom is pumping every 3 hours and getting about 12 ounces of EBM per day. She is storing most of her milk right now.   Infant feeds about every 2 x a night and every 2.5-3.5 hours at a time. She is able to take up to 3.5-4.5 ounces per feeding. Infant is being paced bottle fed using the Avent Natural Flow Nipple and tolerating the feeding well.   Infant latched to both breasts easily and fed actively for about 15 minutes. Infant transferred 16 ml. Infant finished feeding with a bottle. Infant clicks on the bottle. Mom pace feeds infant.   Infant to follow up with Dr. Maisie Fus in October. Infant to follow up with GI at end of September or sooner. Infant to follow up with Lactation in 1 week.     General Information: Mother's reason for visit: Follow up feeding assessment Consult: Follow-up Lactation consultant: Nonah Mattes RN,IBCLC Breastfeeding experience: BF once a day and on EleCare formula otherwise Maternal medical conditions: Thyroid, Infertility(IVF, Donor egg. 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) Maternal medications:  Pre-natal vitamin  Breastfeeding History: Frequency of breast feeding: once a day Duration of feeding: 10+ minutes, uses both breasts with each feeding, easily distracted  Supplementation: Supplement method: bottle(Avent Natural Flow Nipple) Brand: Other(EleCare) Formula volume: 3.5-4.5 ounces Formula frequency: every 3.5-4.5   Breast milk volume: 3.5-4.5 ounces Breast milk frequency: 1 x a day Total breast milk volume per day: 3.5-4.5 ounces Pump type: Spectra Pump frequency: every 3 hours Pump volume: 12 ounces a day  Infant Output Assessment: Voids per 24 hours: 8+ Urine color: Clear yellow Stools per 24 hours: 2-3 Stool color: Yellow  Breast Assessment: Breast: Soft, Compressible, Scars Nipple: Erect Pain level: 0 Pain interventions: Bra, Breast pump  Feeding Assessment: Infant oral assessment: Variance Infant oral assessment comment: Infant with sucking blister to upper center lip. She latches well with no pain to the breast. Nipple rounded post feeding. infant with good tongue mobility and strong suckle. Positioning: Cradle Latch: 2 - Grasps breast easily, tongue down, lips flanged, rhythmical sucking. Audible swallowing: 1 - A few with stimulation Type of nipple: 2 - Everted at rest and after stimulation Comfort: 2 - Soft/non-tender Hold: 2 - No assistance needed to correctly position infant at breast LATCH score: 9 Latch assessment: Deep Lips flanged: Yes Suck assessment: Displays both   Pre-feed weight: 4660 grams Post feed weight: 4676 grams Amount transferred: 16 ml  Amount Supplemented 90 ml formula via bottle.     Additional Feeding Assessment:  Totals: Total amount transferred: 16 ml Total supplement given: infant took 2.5 ounces prior to appt Total amount pumped post feed: mom did not pump   Plan:   1. Offer infant the breast once a day per Gastroenterologist, until he approves of more breast  feeding 2. Practice skin to skin with infant as much as you can 3. Baby wearing can be very helpful 4. Offer both breasts with each feeding 5. Offer infant a bottle of pumped breast milk or formula after each breast feeding  6. Infant needs about 86-115 ml (3-4 ounces) for 8 feedings a day or 690-920 ml (23-31 ounces) in 24 hours. Infant may take more or less depending on how often she feeds. Feed infant until she is satisfied.  7. Continue the paced bottle feeding that you are doing 8. Would recommend that you continue pumping about 8 x a day to protect and promote milk supply 9. Keep up the good work 10. Thank you for allowing me to assist you today 11. Please call with questions/concerns as needed [phone] 12. Follow up with Lactation in 1 week  Ed BlalockSharon S Odai Wimmer RN, IBCLC                                                    Silas FloodSharon S Shajuana Mclucas 08/17/2019, 1:20 PM

## 2019-08-24 ENCOUNTER — Ambulatory Visit: Payer: Self-pay

## 2019-08-24 NOTE — Lactation Note (Signed)
This note was copied from a baby's chart. Lactation Consultation Note  Patient Name: Rachel Wright ZOXWR'UToday's Date: 9/8/2020[name]    08/24/2019  Name: Rachel Wright MRN: [id number] Date of Birth: 06/01/2019 Gestational Age: Gestational Age: 2642w5d Birth Weight: 107.4 oz Weight today:    11 pounds 0.7 ounces (5010 grams) with clean size 711 diaper  392 month old infant presents today with mom for follow up feeding assessment. Infant has been BF once a day.   Infant has gained 350 grams in the last 7 days with an average daily weight gain of 50 grams a day. Weight gain has improved in the last week.   Infant being BF once a day and is going to breast easily. Infant is bottle feeding Elecare (4 ounces) with most feedings and EBM (5 ounces) once a day. Infant is feeding about every 3-3.5 hours around the clock.   Mom is pumping about every 7 x a day and getting . She has adjusted her pumping times to allow for 5-6 hours of sleeping at night. Mom is returning to work this week. Mom is getting about 10-12 ounces of breast milk a day.   Mom has been eliminating dairy from her diet. She is eating cheese occasionally. Mom reports infant is tolerating breast milk well. Infant is to follow up with GI in a few weeks. Mom is hoping to be about to transition infant to more breast milk as she can tolerate it.   Infant wearing Pav[place] harness and tolerating well. Mom takes out of harness when BF.   Infant to follow up with GI in a few weeks. Infant to follow up with Dr. Eddie Candleummings in October. Infant to follow up with Lactation as needed.    General Information: Mother's reason for visit: Follow up feeding assessment, weight check Consult: Follow-up Lactation consultant: Noralee StainSharon Zarea Diesing RN,IBCLC Breastfeeding experience: BF once a day Maternal medical conditions: Infertility, Thyroid(IVF, Donor egg. 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) Maternal medications: Pre-natal  vitamin  Breastfeeding History: Frequency of breast feeding: once a day Duration of feeding: 15-20 minutes  Supplementation: Supplement method: bottle(Evenflo slow Flow Nipple) Brand: Other(EleCare formula) Formula volume: 4 ounces Formula frequency: every 3-3.5 hours   Breast milk volume: 5 ounces Breast milk frequency: once a day Total breast milk volume per day: 5 ounces + BF Pump type: Spectra Pump frequency: every 3 hours with a 5-6 hour stretch at night for rest Pump volume: 10-12 ounces a day  Infant Output Assessment: Voids per 24 hours: 8+ Urine color: Clear yellow Stools per 24 hours: 2-3 Stool color: Yellow  Breast Assessment: Breast: Soft, Compressible Nipple: Erect Pain level: 0 Pain interventions: Bra, Breast pump  Feeding Assessment: Infant oral assessment: Variance Infant oral assessment comment: Infant with thin labial frenulum that inserts near the bottom of the gum ridge, upper lip flanges well. infant with sucking blister to upper center lip. infant with thin posterior lingual frenulum. Infant able to elevate tongue to roof of mouth.and has a strong suckle on gloved finger. infant with some intermittent clicking at the beginning of the feeding on the bottle. none on the breast. mom with no nipple pain, nipples rounded post feeding, Positioning: Cradle(both breasts, 15 minutes total) Latch: 1 - Repeated attempts needed to sustain latch, nipple held in mouth throughout feeding, stimulation needed to elicit sucking reflex. Audible swallowing: 1 - A few with stimulation Type of nipple: 2 - Everted at rest and after stimulation Comfort: 2 - Soft/non-tender Hold:  2 - No assistance needed to correctly position infant at breast LATCH score: 8 Latch assessment: Deep Lips flanged: Yes Suck assessment: Displays both   Pre-feed weight: 5010 grams Post feed weight: 5030 grams Amount transferred: 20 ml Amount supplemented: 4 ounces Elecare Formula via  bottle  Additional Feeding Assessment:                                    Totals: Total amount transferred: 20 ml Total supplement given: 120 ml formula via bottle Total amount pumped post feed: did not pump   Plan:   1. Offer infant the breast once a day per Gastroenterologist, until he approves of more breast feeding 2. Practice skin to skin with infant as much as you can 3. Baby wearing can be very helpful 4. Offer both breasts with each breast feeding 5. Offer infant a bottle of pumped breast milk or formula after each breast feeding  6. Infant needs about 94-125 ml (3-4 ounces) for 8 feedings a day or [phone] ml (25-33 ounces) in 24 hours. Infant may take more or less depending on how often she feeds. Feed infant until she is satisfied.  7. Continue the paced bottle feeding that you are doing 8. Would recommend that you continue pumping about 8 x a day to protect and promote milk supply 9. Keep up the good work 41. Thank you for allowing me to assist you today 11. Please call with questions/concerns as needed [phone] 12. Follow up with Lactation as needed  Donn Pierini RN, IBCLC                                                     Rachel Wright 08/24/2019, 8:24 AM

## 2020-05-02 IMAGING — US US THYROID
1 series · 14 of 25 positions shown · non-contrast
Comparison: None.

CLINICAL DATA: Goiter.

EXAM:
THYROID ULTRASOUND
TECHNIQUE: Ultrasound examination of the thyroid gland and adjacent soft
tissues was performed.

[Series 1: us thyroid · 0.05mm/px · 14 of 38 slices shown]
[im 1/38]
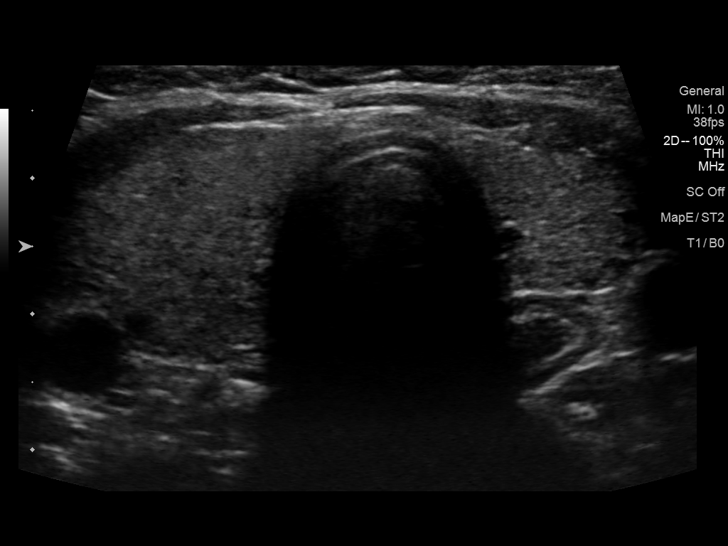
[im 4/38]
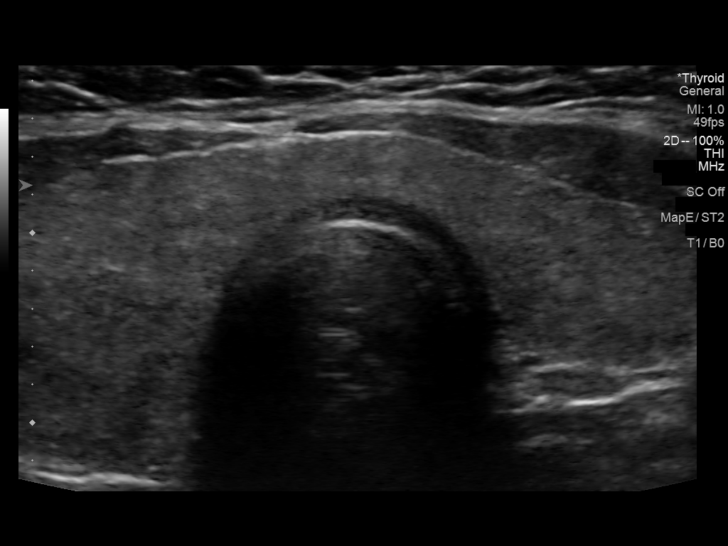
[im 7/38]
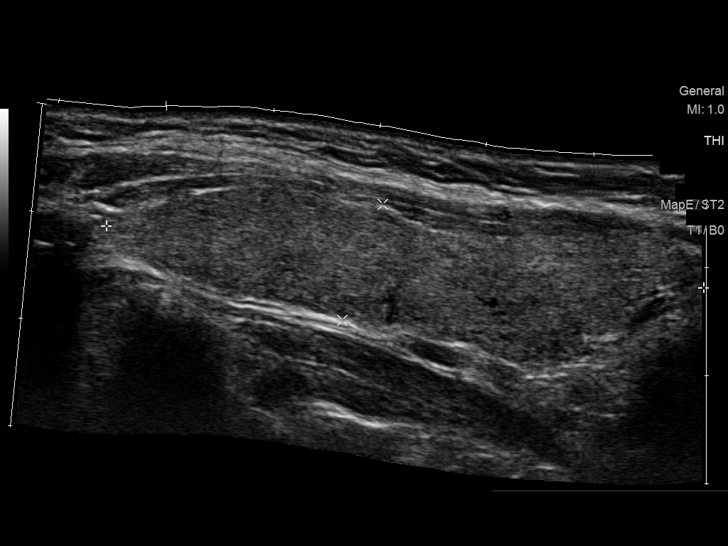
[im 10/38]
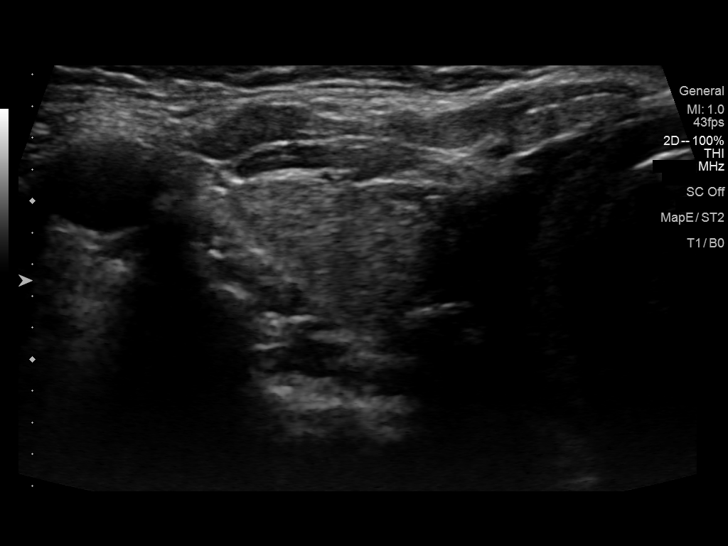
[im 13/38]
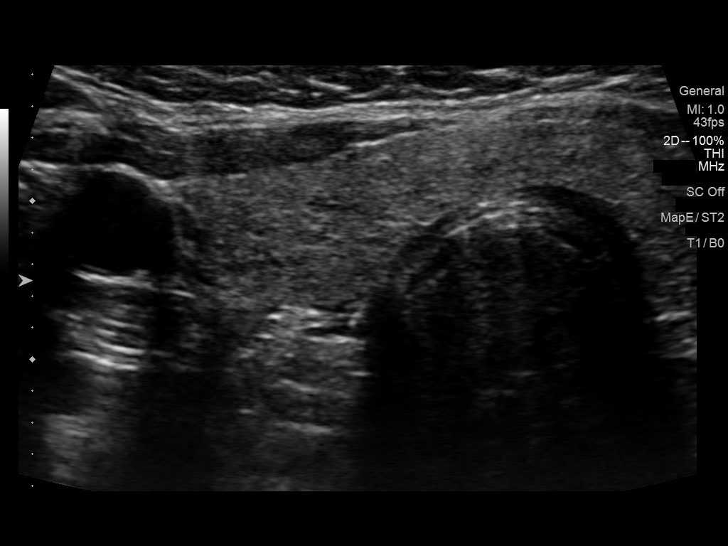
[im 14/38]
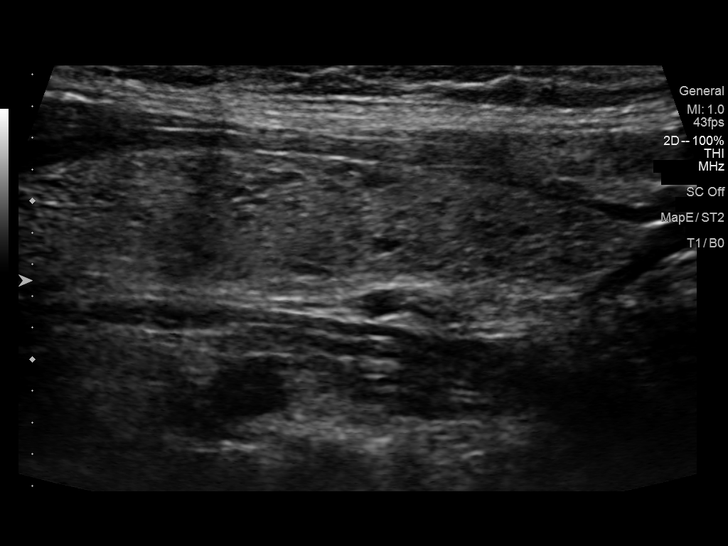
[im 17/38]
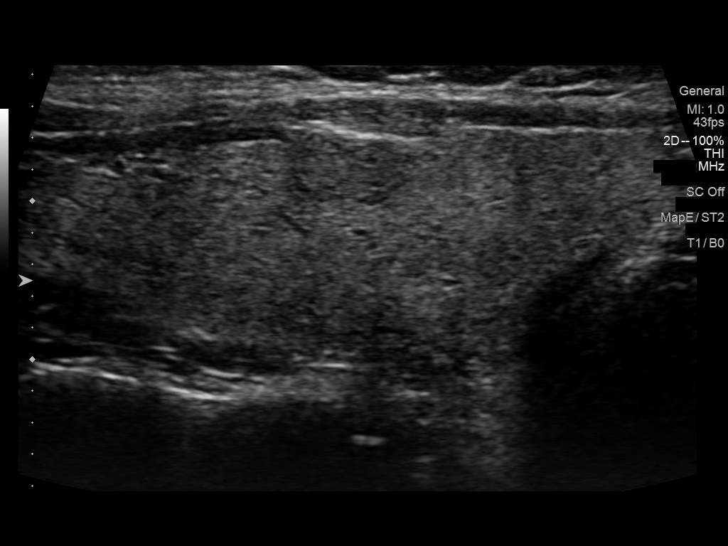
[im 21/38]
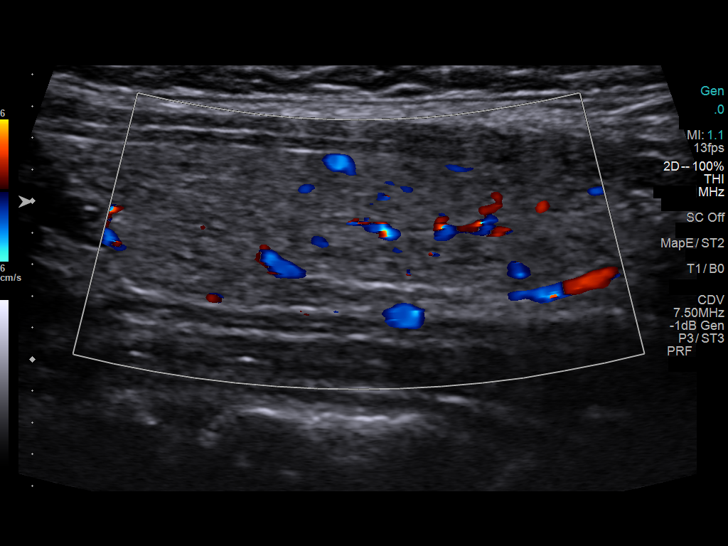
[im 24/38]
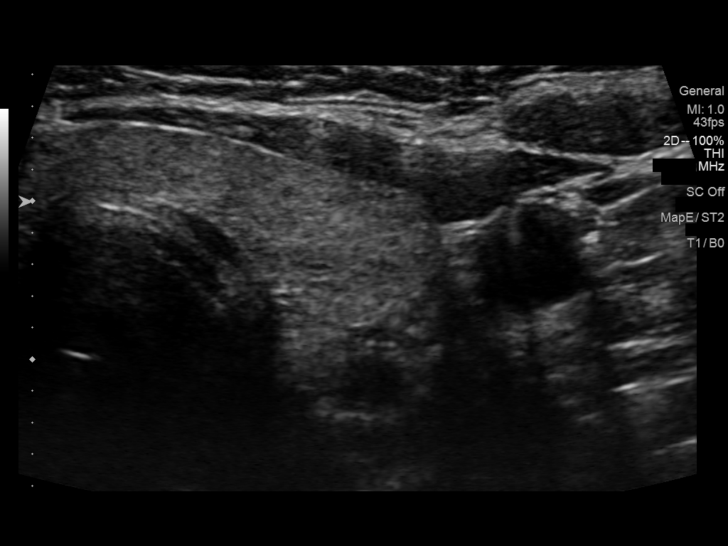
[im 25/38]
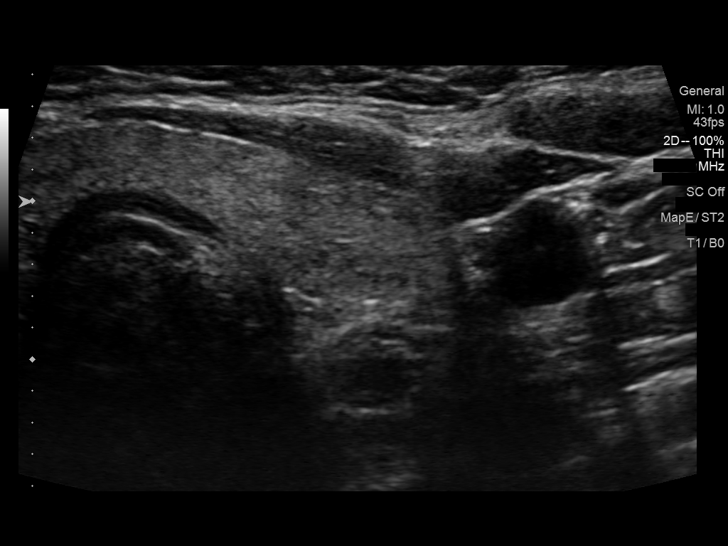
[im 28/38]
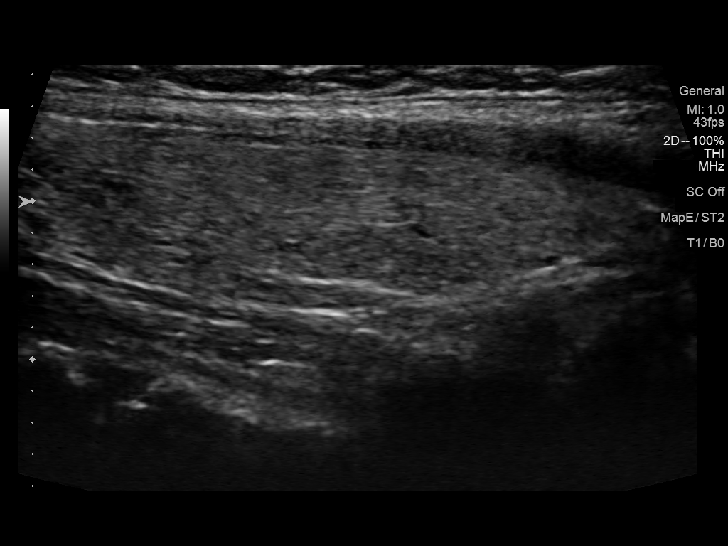
[im 31/38]
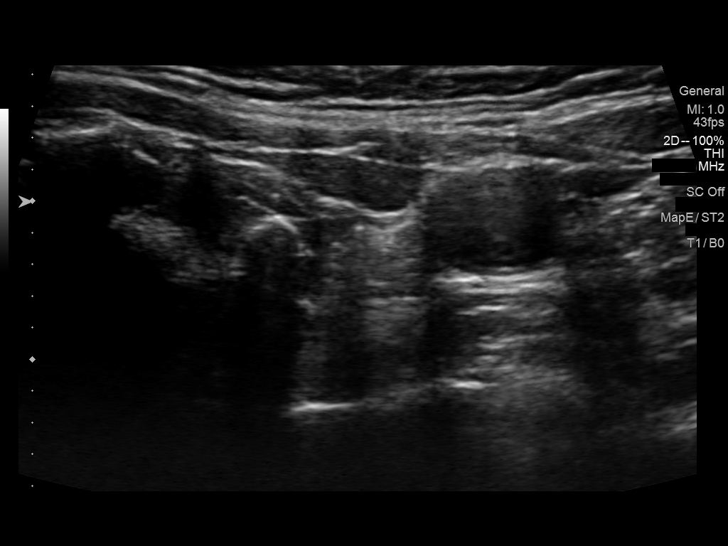
[im 34/38]
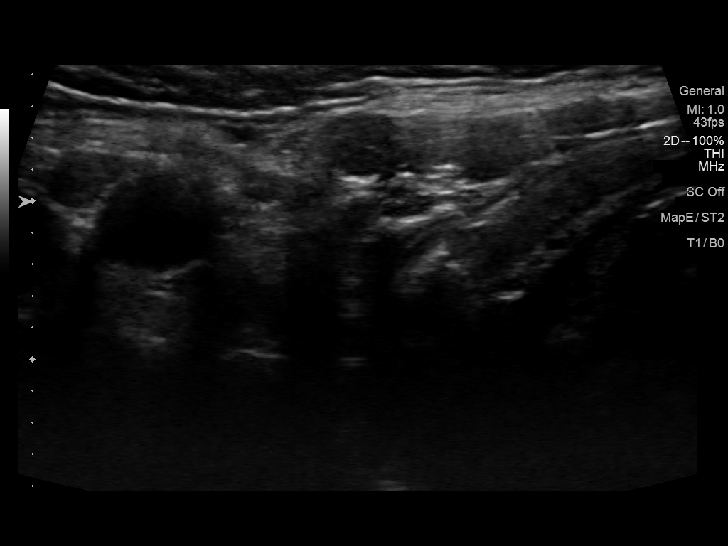
[im 38/38]
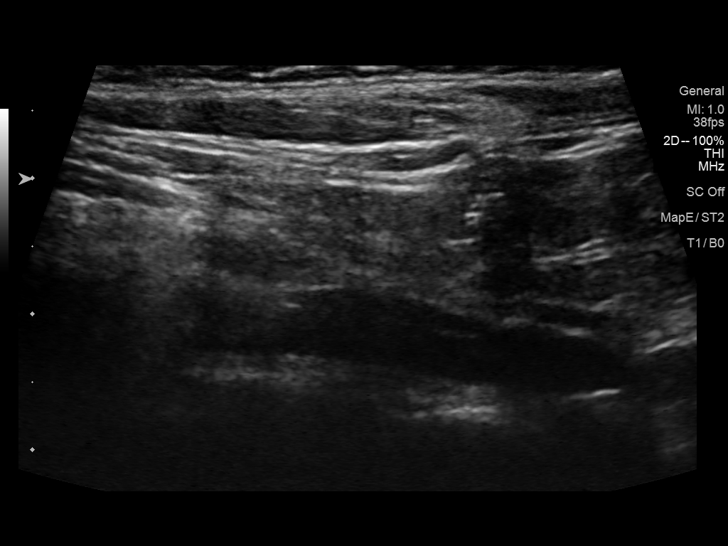

[14 of 25 positions shown; findings below may reference images not displayed]

FINDINGS: Parenchymal Echotexture: Mildly heterogenous

Isthmus: Normal in size measures 0.4 cm in diameter

Right lobe: Normal in size measuring 5.6 x 1.1 x 1.9 cm

Left lobe: Normal in size measuring 4.1 x 0.9 x 1.4 cm

_________________________________________________________

Estimated total number of nodules >/= 1 cm: 0

Number of spongiform nodules >/=  2 cm not described below (TR1): 0

Number of mixed cystic and solid nodules >/= 1.5 cm not described
below (TR2): 0

_________________________________________________________

No discrete nodules are seen within the thyroid gland.
IMPRESSION: Mildly heterogeneous but normal sized thyroid gland without discrete
nodule or mass. Findings are nonspecific though could be seen in the
setting of a thyroiditis. Clinical correlation is advised.

## 2020-11-06 LAB — OB RESULTS CONSOLE HIV ANTIBODY (ROUTINE TESTING): HIV: NONREACTIVE

## 2020-11-06 LAB — OB RESULTS CONSOLE ABO/RH: RH Type: POSITIVE

## 2020-11-06 LAB — OB RESULTS CONSOLE HEPATITIS B SURFACE ANTIGEN: Hepatitis B Surface Ag: NEGATIVE

## 2020-11-06 LAB — OB RESULTS CONSOLE RUBELLA ANTIBODY, IGM: Rubella: IMMUNE

## 2020-11-06 LAB — OB RESULTS CONSOLE GC/CHLAMYDIA
Chlamydia: NEGATIVE
Gonorrhea: NEGATIVE

## 2020-11-06 LAB — OB RESULTS CONSOLE ANTIBODY SCREEN: Antibody Screen: NEGATIVE

## 2020-11-06 LAB — OB RESULTS CONSOLE RPR: RPR: NONREACTIVE

## 2020-11-07 ENCOUNTER — Other Ambulatory Visit: Payer: Self-pay

## 2020-11-22 ENCOUNTER — Other Ambulatory Visit: Payer: Self-pay | Admitting: Obstetrics & Gynecology

## 2020-11-22 DIAGNOSIS — R769 Abnormal immunological finding in serum, unspecified: Secondary | ICD-10-CM

## 2020-12-16 NOTE — L&D Delivery Note (Signed)
Delivery Note/VBAC/Twins  Pushing x 1 hour to LOA/+3.  Discussed R&B of VE and informed consent obtained.  VE applied and 3rd pull SVD viable female Apgars 8,9 over 1st degree lac Cord blood obtained and clamp placed on cord of A  Vtx of B guided to pelvis and with Pushing came to C/C/+2 AROM clear fluid.  VE applied and one pull SVD of viable Female.  Cord blood obtained and Placenta delivered spontaneously intact with 3VCords and sent to path.. Repair with 3-0 Chromic of 1st degree lac with good support and hemostasis noted.  R/V exam confirms.  PH art was sent.   Mother and baby to couplet care and are doing well.  EBL 300cc  Candice Camp, MD

## 2020-12-25 ENCOUNTER — Encounter: Payer: Self-pay | Admitting: *Deleted

## 2020-12-26 ENCOUNTER — Other Ambulatory Visit: Payer: Self-pay

## 2020-12-27 ENCOUNTER — Ambulatory Visit: Payer: BC Managed Care – PPO | Attending: Obstetrics & Gynecology

## 2020-12-27 ENCOUNTER — Ambulatory Visit (HOSPITAL_BASED_OUTPATIENT_CLINIC_OR_DEPARTMENT_OTHER): Payer: BC Managed Care – PPO | Admitting: Obstetrics and Gynecology

## 2020-12-27 ENCOUNTER — Ambulatory Visit: Payer: BC Managed Care – PPO

## 2020-12-27 ENCOUNTER — Encounter: Payer: Self-pay | Admitting: *Deleted

## 2020-12-27 ENCOUNTER — Ambulatory Visit: Payer: BC Managed Care – PPO | Admitting: *Deleted

## 2020-12-27 ENCOUNTER — Other Ambulatory Visit: Payer: Self-pay

## 2020-12-27 VITALS — BP 140/92 | HR 97

## 2020-12-27 DIAGNOSIS — O09812 Supervision of pregnancy resulting from assisted reproductive technology, second trimester: Secondary | ICD-10-CM | POA: Insufficient documentation

## 2020-12-27 DIAGNOSIS — R769 Abnormal immunological finding in serum, unspecified: Secondary | ICD-10-CM

## 2020-12-27 DIAGNOSIS — O34219 Maternal care for unspecified type scar from previous cesarean delivery: Secondary | ICD-10-CM | POA: Insufficient documentation

## 2020-12-27 DIAGNOSIS — O30032 Twin pregnancy, monochorionic/diamniotic, second trimester: Secondary | ICD-10-CM | POA: Diagnosis not present

## 2020-12-27 DIAGNOSIS — Z3A19 19 weeks gestation of pregnancy: Secondary | ICD-10-CM | POA: Insufficient documentation

## 2020-12-27 DIAGNOSIS — O360920 Maternal care for other rhesus isoimmunization, second trimester, not applicable or unspecified: Secondary | ICD-10-CM

## 2020-12-27 DIAGNOSIS — O43122 Velamentous insertion of umbilical cord, second trimester: Secondary | ICD-10-CM

## 2020-12-27 DIAGNOSIS — Z363 Encounter for antenatal screening for malformations: Secondary | ICD-10-CM

## 2020-12-27 DIAGNOSIS — O321XX2 Maternal care for breech presentation, fetus 2: Secondary | ICD-10-CM

## 2020-12-28 ENCOUNTER — Ambulatory Visit: Payer: BC Managed Care – PPO

## 2020-12-28 NOTE — Progress Notes (Signed)
Maternal-Fetal Medicine   Name: Rachel Wright DOB: August 24, 1986 MRN: [id number] Referring Provider: Mitchel Honour, MD  I had the pleasure of seeing [facility] at [facility]. She is G2 P1 at 19w 6d gestation and is here for fetal anatomy scan and consultation. Her high-risk pregnancy problems include: -Monochorionic-diamniotic twin pregnancy. -Anti-E antibodies. -Previous cesarean delivery. -Velamentous cord/marginal cord insertion  Patient had IVF and one embryo transfer (day 5). Monochorionic-diamniotic twin pregnancy was confirmed on early pregnancy scan. On first-trimester screening, the risks for fetal aneuploidies are not increased. She reports her blood pressures at prenatal visits were within normal range.  Obstetric history is significant for a term cesarean delivery (breech presentation).  Patient reports her pregnancy was also complicated by gestational hypertension.  She did not have gestational diabetes.  GYN history: No history of abnormal Pap smears or cervical surgeries.  She had breast biopsy that was reported as benign.  Her previous menstrual cycles were regular.  Past medical history: No history of hypertension or diabetes or any other chronic medical conditions. Past surgical history: Cesarean section. Medications: Low-dose aspirin, prenatal vitamins. Allergies: Ceclor (?) "rashes." Social history: Denies tobacco or drug or alcohol use.  She has been married for years and her husband is in good health.  He is Caucasian. Family history: Mother has a history of deep vein thrombosis.  No history of diabetes or hypertension.  Her blood pressures today at our office were 144/90- and 140/92-mm Hg.  Ultrasound We confirmed monochorionic-diamniotic twin pregnancy.  Twin A: Maternal right, cephalic presentation, posterior placenta. Velamentous/marginal cord insertion is seen. Amniotic fluid is normal and good fetal activity is seen.  Fetal bladder is seen well. Fetal biometry is consistent with her previously established dates. No markers of fetal aneuploidies or structural defects are seen.  Twin B: Maternal left, breech presentation, posterior placenta. Amniotic fluid is normal and good fetal activity is seen. Fetal bladder is seen well. Fetal biometry is consistent with her previously established dates. No markers of fetal aneuploidies or structural defects are seen.  Growth discordancy: 8% (normal).  No evidence of twin-to-twin transfusion syndrome (TTTS).   Our concerns include: Monochorionic-diamniotic twin pregnancy -Explained chorionicity and its implications. -Monochorionic twins have a higher rate of complications including congenital malformations, twin to twin transfusion syndrome (TTTS) (15%), selective growth restriction, and fetal demise of one or both twins.  -Twin pregnancies are associated with increased likelihood of gestational diabetes, gestational hypertension, or preeclampsia, malpresentations, cesarean delivery and postpartum hemorrhage.  -Preterm delivery is the most-common complication of twin pregnancies.  -I discussed ultrasound surveillance for TTTS every 2 weeks from 16 weeks until delivery.  -I discussed timing and mode of delivery. We recommend delivery at 37 weeks in monochorionic twins to prevent the likelihood of stillbirth of one or both twins that is increased in monochorionic twins. Earlier delivery may be indicated if this pregnancy is complicated by fetal growth restriction or other maternal complications.  -In cephalic/cephalic presentations, vaginal delivery can be safely undertaken. In non-vertex presentation of twin A, we recommend cesarean delivery. Patient had previous cesarean section. She can be counseled on VBAC or elective cesarean section at your office in the third trimester. I did not counsel on internal podalic version of non-vertex second twin.  -Monochorionic twins  are more common in IVF pregnancies.  Low-dose aspirin is beneficial in preventing or delaying gestational hypertension/preeclampsia. Patient is already taking low-dose aspirin.  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  Patient's blood type is A Positive. Anti-E antibodies can have additive effect if the patient has anti-D antibodies in causing fetal hemolysis. Her current antibody titers are too weak to titer and is unlikely to cause fetal hemolysis.  I recommend repeating titers in 4 to 6 weeks. If titers are increasing (1:8 or higher), we recommend serial middle-cerebral artery Doppler studies.  I also discussed screening her husband for E antigen. If he is E negative, fetal surveillance for anemia is not necessary. Since patient did not have blood transfusions, it is more likely that her first pregnancy (her daughter with positive E antigen) led to anti-E antibodies.  Velamentous cord insertion/marginal cord insertion  It was difficult to rule out velamentous cord insertion. I explained the finding with the help of a diagram. I informed her that the location is not near the cervix and there is no evidence of vasa previa. Velamentous cord insertion can be associated with fetal growth restriction in some cases. Generally, they are not associated with adverse outcomes including rupture of the vessels (more common with vasa previa).  Previous cesarean delivery We briefly discussed VBAC (with favorable presentation) and the option of elective cesarean delivery. Patient should be counseled close to delivery based on the presentations. There was no evidence of previa on today's ultrasound.  Recommendations: -Ultrasound every 2 weeks for twin-to-twin transfusion surveillance. -Fetal growth assessment every 4 weeks. -Weekly BPP from 32 weeks till delivery. -Delivery at [redacted] weeks gestation provided antenatal testing is reassuring and no other complications exist. -We have  requested an appointment for fetal echocardiography ([facility]). -Placental cord insertion (twin A) to be confirmed on follow-up ultrasound. -Repeat anti-E antibody titer around 24 weeks. -MCA Doppler studies if antibody levels are 1:8 or greater. -Consider screening her husband for E antigen status if the patient opts for screening.  -Continue low-dose aspirin. -Frequent blood pressure monitoring to rule out chronic hypertension.  Thank you for consultation. Please contact me if you have any questions. Patient would like to follow your recommendations on ultrasound follow-up.  Consultation including face-to-face counseling 45 minutes.

## 2021-04-19 ENCOUNTER — Telehealth (HOSPITAL_COMMUNITY): Payer: Self-pay | Admitting: *Deleted

## 2021-04-19 NOTE — Telephone Encounter (Signed)
Preadmission screen  

## 2021-04-23 ENCOUNTER — Telehealth (HOSPITAL_COMMUNITY): Payer: Self-pay | Admitting: *Deleted

## 2021-04-23 LAB — OB RESULTS CONSOLE GBS: GBS: NEGATIVE

## 2021-04-23 NOTE — Telephone Encounter (Signed)
Preadmission screen  

## 2021-04-24 ENCOUNTER — Telehealth (HOSPITAL_COMMUNITY): Payer: Self-pay | Admitting: *Deleted

## 2021-04-24 NOTE — Telephone Encounter (Signed)
Preadmission screen  

## 2021-04-25 ENCOUNTER — Encounter (HOSPITAL_COMMUNITY): Payer: Self-pay | Admitting: *Deleted

## 2021-04-25 ENCOUNTER — Telehealth (HOSPITAL_COMMUNITY): Payer: Self-pay | Admitting: *Deleted

## 2021-04-25 ENCOUNTER — Other Ambulatory Visit: Payer: Self-pay

## 2021-04-25 ENCOUNTER — Encounter (HOSPITAL_COMMUNITY): Payer: Self-pay | Admitting: Obstetrics and Gynecology

## 2021-04-25 ENCOUNTER — Inpatient Hospital Stay (HOSPITAL_COMMUNITY)
Admission: AD | Admit: 2021-04-25 | Discharge: 2021-04-25 | Disposition: A | Discharge status: Home / Self Care | Payer: BC Managed Care – PPO | Attending: Obstetrics and Gynecology | Admitting: Obstetrics and Gynecology

## 2021-04-25 DIAGNOSIS — Z79899 Other long term (current) drug therapy: Secondary | ICD-10-CM | POA: Diagnosis not present

## 2021-04-25 DIAGNOSIS — Z3A36 36 weeks gestation of pregnancy: Secondary | ICD-10-CM | POA: Diagnosis not present

## 2021-04-25 DIAGNOSIS — Z7982 Long term (current) use of aspirin: Secondary | ICD-10-CM | POA: Insufficient documentation

## 2021-04-25 DIAGNOSIS — O163 Unspecified maternal hypertension, third trimester: Secondary | ICD-10-CM | POA: Diagnosis not present

## 2021-04-25 DIAGNOSIS — O30033 Twin pregnancy, monochorionic/diamniotic, third trimester: Secondary | ICD-10-CM

## 2021-04-25 DIAGNOSIS — O30003 Twin pregnancy, unspecified number of placenta and unspecified number of amniotic sacs, third trimester: Secondary | ICD-10-CM | POA: Diagnosis not present

## 2021-04-25 DIAGNOSIS — O09293 Supervision of pregnancy with other poor reproductive or obstetric history, third trimester: Secondary | ICD-10-CM | POA: Insufficient documentation

## 2021-04-25 DIAGNOSIS — Z87891 Personal history of nicotine dependence: Secondary | ICD-10-CM | POA: Insufficient documentation

## 2021-04-25 DIAGNOSIS — O368130 Decreased fetal movements, third trimester, not applicable or unspecified: Secondary | ICD-10-CM | POA: Insufficient documentation

## 2021-04-25 LAB — CBC
HCT: 34.9 % — ABNORMAL LOW (ref 36.0–46.0)
Hemoglobin: 11.9 g/dL — ABNORMAL LOW (ref 12.0–15.0)
MCH: 30.7 pg (ref 26.0–34.0)
MCHC: 34.1 g/dL (ref 30.0–36.0)
MCV: 90.2 fL (ref 80.0–100.0)
Platelets: 227 10*3/uL (ref 150–400)
RBC: 3.87 MIL/uL (ref 3.87–5.11)
RDW: 13.1 % (ref 11.5–15.5)
WBC: 8.5 10*3/uL (ref 4.0–10.5)
nRBC: 0 % (ref 0.0–0.2)

## 2021-04-25 LAB — COMPREHENSIVE METABOLIC PANEL
ALT: 12 U/L (ref 0–44)
AST: 20 U/L (ref 15–41)
Albumin: 2.5 g/dL — ABNORMAL LOW (ref 3.5–5.0)
Alkaline Phosphatase: 315 U/L — ABNORMAL HIGH (ref 38–126)
Anion gap: 6 (ref 5–15)
BUN: 12 mg/dL (ref 6–20)
CO2: 20 mmol/L — ABNORMAL LOW (ref 22–32)
Calcium: 9.3 mg/dL (ref 8.9–10.3)
Chloride: 109 mmol/L (ref 98–111)
Creatinine, Ser: 0.67 mg/dL (ref 0.44–1.00)
GFR, Estimated: >60 mL/min (ref >60)
Glucose, Bld: 124 mg/dL — ABNORMAL HIGH (ref 70–99)
Potassium: 4 mmol/L (ref 3.5–5.1)
Sodium: 135 mmol/L (ref 135–145)
Total Bilirubin: 0.6 mg/dL (ref 0.3–1.2)
Total Protein: 5.7 g/dL — ABNORMAL LOW (ref 6.5–8.1)

## 2021-04-25 LAB — URINALYSIS, ROUTINE W REFLEX MICROSCOPIC
Bilirubin Urine: NEGATIVE
Glucose, UA: NEGATIVE mg/dL
Hgb urine dipstick: NEGATIVE
Ketones, ur: NEGATIVE mg/dL
Leukocytes,Ua: NEGATIVE
Nitrite: NEGATIVE
Protein, ur: NEGATIVE mg/dL
Specific Gravity, Urine: 1.013 (ref 1.005–1.030)
pH: 6 (ref 5.0–8.0)

## 2021-04-25 LAB — PROTEIN / CREATININE RATIO, URINE
Creatinine, Urine: 93.6 mg/dL
Protein Creatinine Ratio: 0.1 mg/mg{Cre} (ref 0.00–0.15)
Total Protein, Urine: 9 mg/dL

## 2021-04-25 NOTE — MAU Note (Signed)
Pt reports decreased fetal movement since this morning, denies cntrx, VB, LOF. Has mo-di twins.

## 2021-04-25 NOTE — Telephone Encounter (Signed)
Preadmission screen  

## 2021-04-25 NOTE — Discharge Instructions (Signed)
Preeclampsia and Eclampsia Preeclampsia is a serious condition that may develop during pregnancy. This condition involves high blood pressure during pregnancy and causes symptoms such as headaches, vision changes, and increased swelling in the legs, hands, and face. Preeclampsia occurs after 20 weeks of pregnancy. Eclampsia is a seizure that happens from worsening preeclampsia. Diagnosing and managing preeclampsia early is important. If not treated early, it can cause serious problems for mother and baby. There is no cure for this condition. However, during pregnancy, delivering the baby may be the best treatment for preeclampsia or eclampsia. For most women, symptoms of preeclampsia and eclampsia go away after giving birth. In rare cases, a woman may develop preeclampsia or eclampsia after giving birth. This usually occurs within 48 hours after childbirth but may occur up to 6 weeks after giving birth. What are the causes? The cause of this condition is not known. What increases the risk? The following factors make you more likely to develop preeclampsia:  Being pregnant for the first time or being pregnant with multiples.  Having had preeclampsia or a condition called hemolysis, elevated liver enzymes, and low platelet count (HELLP)syndrome during a past pregnancy.  Having a family history of preeclampsia.  Being older than age 35.  Being obese.  Becoming pregnant through fertility treatments. Conditions that reduce blood flow or oxygen to your placenta and baby may also increase your risk. These include:  High blood pressure before, during, or immediately following pregnancy.  Kidney disease.  Diabetes.  Blood clotting disorders.  Autoimmune diseases, such as lupus.  Sleep apnea. What are the signs or symptoms? Common symptoms of this condition include:  A severe, throbbing headache that does not go away.  Vision problems, such as blurred or double vision and light  sensitivity.  Pain in the stomach, especially the right upper region.  Pain in the shoulder. Other symptoms that may develop as the condition gets worse include:  Sudden weight gain because of fluid buildup in the body. This causes swelling of the face, hands, legs, and feet.  Severe nausea and vomiting.  Urinating less than usual.  Shortness of breath.  Seizures. How is this diagnosed? Your health care provider will ask you about symptoms and check for signs of preeclampsia during your prenatal visits. You will also have routine tests, including:  Checking your blood pressure.  Urine tests to check for protein.  Blood tests to assess your organ function.  Monitoring your baby's heart rate.  Ultrasounds to check fetal growth.   How is this treated? You and your health care provider will determine the treatment that is best for you. Treatment may include:  Frequent prenatal visits to check for preeclampsia.  Medicine to lower your blood pressure.  Medicine to prevent seizures.  Low-dose aspirin during your pregnancy.  Staying in the hospital, in severe cases. You will be given medicines to control your blood pressure and the amount of fluids in your body.  Delivering your baby. Work with your health care provider to manage any chronic health conditions, such as diabetes or kidney problems. Also, work with your health care provider to manage weight gain during pregnancy. Follow these instructions at home: Eating and drinking  Drink enough fluid to keep your urine pale yellow.  Avoid caffeine. Caffeine may increase blood pressure and heart rate and lead to dehydration.  Reduce the amount of salt that you eat. Lifestyle  Do not use any products that contain nicotine or tobacco. These products include cigarettes, chewing tobacco, and   vaping devices, such as e-cigarettes. If you need help quitting, ask your health care provider.  Do not use alcohol or drugs.  Avoid  stress as much as possible.  Rest and get plenty of sleep. General instructions  Take over-the-counter and prescription medicines only as told by your health care provider.  When lying down, lie on your left side. This keeps pressure off your major blood vessels.  When sitting or lying down, raise (elevate) your feet. Try putting pillows underneath your lower legs.  Exercise regularly. Ask your health care provider what kinds of exercise are best for you.  Check your blood pressure as often as recommended by your health care provider.  Keep all prenatal and follow-up visits. This is important.   Contact a health care provider if:  You have symptoms that may need treatment or closer monitoring. These include: ? Headaches. ? Stomach pain or nausea and vomiting. ? Shoulder pain. ? Vision problems, such as spots in front of your eyes or blurry vision. ? Sudden weight gain or increased swelling in your face, hands, legs, and feet. ? Increased anxiety or feeling of impending doom. ? Signs or symptoms of labor. Get help right away if:  You have any of the following symptoms: ? A seizure. ? Shortness of breath or trouble breathing. ? Trouble speaking or slurred speech. ? Fainting. ? Chest pain. These symptoms may represent a serious problem that is an emergency. Do not wait to see if the symptoms will go away. Get medical help right away. Call your local emergency services (911 in the [place] Do not drive yourself to the hospital. Summary  Preeclampsia is a serious condition that may develop during pregnancy.  Diagnosing and treating preeclampsia early is very important.  Keep all prenatal and follow-up visits. This is important.  Get help right away if you have a seizure, shortness of breath or trouble breathing, trouble speaking or slurred speech, chest pain, or fainting. This information is not intended to replace advice given to you by your health care provider. Make sure you  discuss any questions you have with your health care provider. Document Revised: 08/24/2020 Document Reviewed: 08/24/2020 Elsevier Patient Education  2021 Elsevier Inc.  

## 2021-04-25 NOTE — MAU Provider Note (Signed)
History     CSN: [id number]  Arrival date and time: 04/25/21 1800   Event Date/Time   First Provider Initiated Contact with Patient 04/25/21 1852      Chief Complaint  Patient presents with  . Decreased Fetal Movement   Rachel Wright is a 35 y.o. G2P1001 at [redacted]w[redacted]d with mo/di twins who presents today with decreased fetal movement. However she states that since she got here she has been feel a lot of fetal movement. Of note, patient is scheudled for IOL on 05/01/2021. On arrival here patient is hypertensive. She reports that she BP was 130/80 at the office on 04/23/2021 and she has a hx of GHTN with her last pregnancy. She denies any HA or RUQ pain. She reports she has had floaters and this is a somewhat new thing, but unsure exactly when it started.    OB History    Gravida  2   Para  1   Term  1   Preterm      AB      Living  1     SAB      IAB      Ectopic      Multiple  0   Live Births  1           Past Medical History:  Diagnosis Date  . Anxiety   . Breast mass   . Chronic kidney disease    kidney stones  . Depression   . Newborn product of in vitro fertilization (IVF) pregnancy   . Vaginal Pap smear, abnormal     Past Surgical History:  Procedure Laterality Date  . BREAST SURGERY     lumpectomy  . CESAREAN SECTION N/A 06/01/2019   Procedure: CESAREAN SECTION;  Surgeon: Zelphia Cairo, MD;  Location: [facility];  Service: Obstetrics;  Laterality: N/A;  primary edc 06/17/19 allergy to ceclor need RNFA  . TONSILLECTOMY      Family History  Problem Relation Age of Onset  . Heart disease Mother   . Hypertension Mother   . Deep vein thrombosis Mother   . Breast cancer Mother   . Cervical cancer Mother   . Depression Mother   . Anxiety disorder Mother   . Breast cancer Maternal Grandmother     Social History   Tobacco Use  . Smoking status: Former Games developer  . Smokeless tobacco: Never Used  Vaping Use  . Vaping Use: Never used   Substance Use Topics  . Alcohol use: Not Currently  . Drug use: No    Allergies:  Allergies  Allergen Reactions  . Ceclor [Cefaclor] Rash    Medications Prior to Admission  Medication Sig Dispense Refill Last Dose  . aspirin EC 81 MG tablet Take 81 mg by mouth daily. Swallow whole.   04/24/2021 at Unknown time  . Prenatal Vit-Fe Fumarate-FA (PRENATAL MULTIVITAMIN) TABS tablet Take 1 tablet by mouth at bedtime.   04/24/2021 at Unknown time  . sertraline (ZOLOFT) 25 MG tablet Take 25 mg by mouth daily.   04/24/2021 at Unknown time  . acetaminophen (TYLENOL) 500 MG tablet Take 1,000 mg by mouth every 6 (six) hours as needed for mild pain.     . cetirizine (ZYRTEC) 10 MG tablet Take 10 mg by mouth daily. (Patient not taking: Reported on 12/27/2020)     . famotidine (PEPCID) 20 MG tablet Take 20 mg by mouth daily.  (Patient not taking: Reported on 12/27/2020)     . ibuprofen (  ADVIL) 600 MG tablet Take 1 tablet (600 mg total) by mouth every 6 (six) hours. (Patient not taking: Reported on 12/27/2020) 30 tablet 0   . oxyCODONE-acetaminophen (PERCOCET/ROXICET) 5-325 MG tablet Take 1-2 tablets by mouth every 4 (four) hours as needed for moderate pain. (Patient not taking: Reported on 12/27/2020) 30 tablet 0     Review of Systems Physical Exam   Blood pressure 134/89, pulse (!) 103, temperature 99 F (37.2 C), temperature source Oral, resp. rate 18, height 5\' 4"  (1.626 m), weight 84.1 kg, SpO2 97 %, unknown if currently breastfeeding.  Physical Exam Vitals and nursing note reviewed. Exam conducted with a chaperone present.  Constitutional:      General: She is not in acute distress. HENT:     Head: Normocephalic.  Cardiovascular:     Rate and Rhythm: Normal rate.  Pulmonary:     Effort: Pulmonary effort is normal.  Abdominal:     Palpations: Abdomen is soft.  Skin:    General: Skin is warm and dry.  Neurological:     Mental Status: She is alert and oriented to person, place, and time.       NST Baby A Baseline: 135 Variability: moderate Accels: 15x15 Decels: none Toco: irregular Reactive/Appropriate for GA  NST Baby B:  Baseline: 155 Variability: moderate Accels: 15x15 Decels: none Toco: irregular Reactive/Appropriate for GA   Results for orders placed or performed during the hospital encounter of 04/25/21 (from the past 24 hour(s))  Urinalysis, Routine w reflex microscopic Urine, Clean Catch     Status: None   Collection Time: 04/25/21  6:37 PM  Result Value Ref Range   Color, Urine YELLOW YELLOW   APPearance CLEAR CLEAR   Specific Gravity, Urine 1.013 1.005 - 1.030   pH 6.0 5.0 - 8.0   Glucose, UA NEGATIVE NEGATIVE mg/dL   Hgb urine dipstick NEGATIVE NEGATIVE   Bilirubin Urine NEGATIVE NEGATIVE   Ketones, ur NEGATIVE NEGATIVE mg/dL   Protein, ur NEGATIVE NEGATIVE mg/dL   Nitrite NEGATIVE NEGATIVE   Leukocytes,Ua NEGATIVE NEGATIVE  Protein / creatinine ratio, urine     Status: None   Collection Time: 04/25/21  6:37 PM  Result Value Ref Range   Creatinine, Urine 93.60 mg/dL   Total Protein, Urine 9 mg/dL   Protein Creatinine Ratio 0.10 0.00 - 0.15 mg/mg[Cre]  CBC     Status: Abnormal   Collection Time: 04/25/21  6:50 PM  Result Value Ref Range   WBC 8.5 4.0 - 10.5 K/uL   RBC 3.87 3.87 - 5.11 MIL/uL   Hemoglobin 11.9 (L) 12.0 - 15.0 g/dL   HCT 06/25/21 (L) 67.5 - 91.6 %   MCV 90.2 80.0 - 100.0 fL   MCH 30.7 26.0 - 34.0 pg   MCHC 34.1 30.0 - 36.0 g/dL   RDW 38.4 66.5 - 99.3 %   Platelets 227 150 - 400 K/uL   nRBC 0.0 0.0 - 0.2 %  Comprehensive metabolic panel     Status: Abnormal   Collection Time: 04/25/21  6:50 PM  Result Value Ref Range   Sodium 135 135 - 145 mmol/L   Potassium 4.0 3.5 - 5.1 mmol/L   Chloride 109 98 - 111 mmol/L   CO2 20 (L) 22 - 32 mmol/L   Glucose, Bld 124 (H) 70 - 99 mg/dL   BUN 12 6 - 20 mg/dL   Creatinine, Ser 06/25/21 0.44 - 1.00 mg/dL   Calcium 9.3 8.9 - 1.77 mg/dL   Total Protein 5.7 (  L) 6.5 - 8.1 g/dL   Albumin  2.5 (L) 3.5 - 5.0 g/dL   AST 20 15 - 41 U/L   ALT 12 0 - 44 U/L   Alkaline Phosphatase 315 (H) 38 - 126 U/L   Total Bilirubin 0.6 0.3 - 1.2 mg/dL   GFR, Estimated >45 >84 mL/min   Anion gap 6 5 - 15   Patient Vitals for the past 24 hrs:  BP Temp Temp src Pulse Resp SpO2 Height Weight  04/25/21 2015 134/89 -- -- (!) 103 -- 97 % -- --  04/25/21 2000 132/85 -- -- 100 -- 97 % -- --  04/25/21 1945 136/88 -- -- (!) 104 -- 97 % -- --  04/25/21 1930 137/90 -- -- (!) 102 -- 97 % -- --  04/25/21 1915 129/84 -- -- (!) 107 -- 97 % -- --  04/25/21 1900 134/83 -- -- (!) 106 -- 97 % -- --  04/25/21 1845 (!) 138/94 -- -- (!) 105 -- 98 % -- --  04/25/21 1836 (!) 131/95 -- -- (!) 102 -- -- -- --  04/25/21 1818 (!) 140/98 99 F (37.2 C) Oral 99 18 99 % 5\' 4"  (1.626 m) 84.1 kg    MAU Course  Procedures  MDM 2042: DW Dr. 2043, he recommends we review with Dr. Debroah Loop at this time as a case could be made for admission v continued expectant management.   2047: DW Dr. 2048, she is ok with patient being DC home and calling the office for a BP check tomorrow.   Assessment and Plan   1. Monozygotic twins, third trimester   2. [redacted] weeks gestation of pregnancy   3. Hypertension during pregnancy in third trimester, unspecified hypertension in pregnancy type    DC home 3rd Trimester precautions  Pre-eclampsia warning signs  PTL precautions  Fetal kick counts RX: none  Return to MAU as needed FU with OB office tomorrow for blood pressure check    Follow-up Information    Vincente Poli, MD Follow up.   Specialty: Obstetrics and Gynecology Why: call for an appointment tomorrow for blood pressure check in the office  Contact information: [address] [place] [phone]              [phone] DNP, CNM  04/25/21  8:55 PM

## 2021-04-30 ENCOUNTER — Inpatient Hospital Stay (HOSPITAL_COMMUNITY)
Admission: AD | Admit: 2021-04-30 | Discharge: 2021-05-03 | DRG: 806 | Disposition: A | Discharge status: Home / Self Care | Payer: BC Managed Care – PPO | Attending: Obstetrics and Gynecology | Admitting: Obstetrics and Gynecology

## 2021-04-30 ENCOUNTER — Encounter (HOSPITAL_COMMUNITY): Payer: Self-pay | Admitting: Obstetrics and Gynecology

## 2021-04-30 ENCOUNTER — Other Ambulatory Visit (HOSPITAL_COMMUNITY): Payer: BC Managed Care – PPO | Attending: Obstetrics and Gynecology

## 2021-04-30 ENCOUNTER — Other Ambulatory Visit: Payer: Self-pay

## 2021-04-30 DIAGNOSIS — O134 Gestational [pregnancy-induced] hypertension without significant proteinuria, complicating childbirth: Secondary | ICD-10-CM | POA: Diagnosis present

## 2021-04-30 DIAGNOSIS — O30043 Twin pregnancy, dichorionic/diamniotic, third trimester: Secondary | ICD-10-CM | POA: Diagnosis present

## 2021-04-30 DIAGNOSIS — O24425 Gestational diabetes mellitus in childbirth, controlled by oral hypoglycemic drugs: Secondary | ICD-10-CM | POA: Diagnosis present

## 2021-04-30 DIAGNOSIS — Z3A38 38 weeks gestation of pregnancy: Secondary | ICD-10-CM

## 2021-04-30 DIAGNOSIS — O30003 Twin pregnancy, unspecified number of placenta and unspecified number of amniotic sacs, third trimester: Secondary | ICD-10-CM | POA: Diagnosis present

## 2021-04-30 DIAGNOSIS — Z7984 Long term (current) use of oral hypoglycemic drugs: Secondary | ICD-10-CM | POA: Diagnosis not present

## 2021-04-30 DIAGNOSIS — O4103X2 Oligohydramnios, third trimester, fetus 2: Secondary | ICD-10-CM | POA: Diagnosis present

## 2021-04-30 DIAGNOSIS — O4103X1 Oligohydramnios, third trimester, fetus 1: Secondary | ICD-10-CM | POA: Diagnosis present

## 2021-04-30 DIAGNOSIS — O34211 Maternal care for low transverse scar from previous cesarean delivery: Secondary | ICD-10-CM | POA: Diagnosis present

## 2021-04-30 DIAGNOSIS — Z20822 Contact with and (suspected) exposure to covid-19: Secondary | ICD-10-CM | POA: Diagnosis present

## 2021-04-30 DIAGNOSIS — Z87891 Personal history of nicotine dependence: Secondary | ICD-10-CM

## 2021-04-30 LAB — RESP PANEL BY RT-PCR (FLU A&B, COVID) ARPGX2
Influenza A by PCR: NEGATIVE
Influenza B by PCR: NEGATIVE
SARS Coronavirus 2 by RT PCR: NEGATIVE

## 2021-04-30 LAB — CBC
HCT: 36.4 % (ref 36.0–46.0)
Hemoglobin: 12.6 g/dL (ref 12.0–15.0)
MCH: 31.1 pg (ref 26.0–34.0)
MCHC: 34.6 g/dL (ref 30.0–36.0)
MCV: 89.9 fL (ref 80.0–100.0)
Platelets: 239 10*3/uL (ref 150–400)
RBC: 4.05 MIL/uL (ref 3.87–5.11)
RDW: 13 % (ref 11.5–15.5)
WBC: 8.5 10*3/uL (ref 4.0–10.5)
nRBC: 0 % (ref 0.0–0.2)

## 2021-04-30 LAB — GLUCOSE, CAPILLARY: Glucose-Capillary: 175 mg/dL — ABNORMAL HIGH (ref 70–99)

## 2021-04-30 MED ORDER — OXYCODONE-ACETAMINOPHEN 5-325 MG PO TABS: 1.0000 | ORAL_TABLET | ORAL | Status: DC | PRN

## 2021-04-30 MED ORDER — OXYTOCIN BOLUS FROM INFUSION
333.0000 mL | Freq: Once | INTRAVENOUS | Status: AC
  Administered 2021-05-01: 333 mL via INTRAVENOUS

## 2021-04-30 MED ORDER — LIDOCAINE HCL (PF) 1 % IJ SOLN: 30.0000 mL | INTRAMUSCULAR | Status: DC | PRN

## 2021-04-30 MED ORDER — OXYTOCIN-SODIUM CHLORIDE 30-0.9 UT/500ML-% IV SOLN: 2.5000 [IU]/h | INTRAVENOUS | Status: DC

## 2021-04-30 MED ORDER — ACETAMINOPHEN 325 MG PO TABS: 650.0000 mg | ORAL_TABLET | ORAL | Status: DC | PRN

## 2021-04-30 MED ORDER — OXYCODONE-ACETAMINOPHEN 5-325 MG PO TABS: 2.0000 | ORAL_TABLET | ORAL | Status: DC | PRN

## 2021-04-30 MED ORDER — OXYTOCIN-SODIUM CHLORIDE 30-0.9 UT/500ML-% IV SOLN
1.0000 m[IU]/min | INTRAVENOUS | Status: DC
Start: 2021-04-30 — End: 2021-05-02
  Administered 2021-04-30: 1 m[IU]/min via INTRAVENOUS
  Filled 2021-04-30: qty 500

## 2021-04-30 MED ORDER — LACTATED RINGERS IV SOLN: 500.0000 mL | INTRAVENOUS | Status: DC | PRN

## 2021-04-30 MED ORDER — BUTORPHANOL TARTRATE 1 MG/ML IJ SOLN
1.0000 mg | INTRAMUSCULAR | Status: DC | PRN
  Administered 2021-05-01 (×2): 1 mg via INTRAVENOUS
  Filled 2021-04-30 (×2): qty 1

## 2021-04-30 MED ORDER — ZOLPIDEM TARTRATE 5 MG PO TABS
5.0000 mg | ORAL_TABLET | Freq: Every evening | ORAL | Status: DC | PRN
  Administered 2021-04-30: 5 mg via ORAL
  Filled 2021-04-30: qty 1

## 2021-04-30 MED ORDER — TERBUTALINE SULFATE 1 MG/ML IJ SOLN
0.2500 mg | Freq: Once | INTRAMUSCULAR | Status: DC | PRN
  Filled 2021-04-30: qty 1

## 2021-04-30 MED ORDER — SOD CITRATE-CITRIC ACID 500-334 MG/5ML PO SOLN
30.0000 mL | ORAL | Status: DC | PRN
  Administered 2021-05-01 (×2): 30 mL via ORAL
  Filled 2021-04-30 (×2): qty 15

## 2021-04-30 MED ORDER — LACTATED RINGERS IV SOLN
INTRAVENOUS | Status: DC
  Administered 2021-04-30: 125 mL/h via INTRAVENOUS

## 2021-04-30 MED ORDER — ONDANSETRON HCL 4 MG/2ML IJ SOLN
4.0000 mg | Freq: Four times a day (QID) | INTRAMUSCULAR | Status: DC | PRN
  Administered 2021-05-01: 4 mg via INTRAVENOUS
  Filled 2021-04-30: qty 2

## 2021-04-30 NOTE — H&P (Signed)
DELAYLA HOFFMASTER is a 35 y.o. female presenting for IOL of Vertex/Vertex Twins from IVF.  Normal NIPS and fetal growth however AFI today was 3%tile.  SHe also has mild increase in BP to 138/92 without severe features of GHTN.  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  Previous LSTCS for breech, she desires TOLAC.Marland KitchenALso, Anti E ab that remained weakly positive OB History    Gravida  2   Para  1   Term  1   Preterm      AB      Living  1     SAB      IAB      Ectopic      Multiple  0   Live Births  1          Past Medical History:  Diagnosis Date  . Anxiety   . Breast mass   . Chronic kidney disease    kidney stones  . Depression   . Newborn product of in vitro fertilization (IVF) pregnancy   . Vaginal Pap smear, abnormal    Past Surgical History:  Procedure Laterality Date  . BREAST SURGERY     lumpectomy  . CESAREAN SECTION N/A 06/01/2019   Procedure: CESAREAN SECTION;  Surgeon: Zelphia Cairo, MD;  Location: [facility];  Service: Obstetrics;  Laterality: N/A;  primary edc 06/17/19 allergy to ceclor need RNFA  . TONSILLECTOMY     Family History: family history includes Anxiety disorder in her mother; Breast cancer in her maternal grandmother and mother; Cervical cancer in her mother; Deep vein thrombosis in her mother; Depression in her mother; Heart disease in her mother; Hypertension in her mother. Social History:  reports that she has quit smoking. She has never used smokeless tobacco. She reports previous alcohol use. She reports that she does not use drugs.     Maternal Diabetes: Yes:  Diabetes Type:  Insulin/Medication controlled Genetic Screening: Normal Maternal Ultrasounds/Referrals: Normal Fetal Ultrasounds or other Referrals:  Referred to Materal Fetal Medicine  Maternal Substance Abuse:  No Significant Maternal Medications:  Meds include: Other: glyburide Significant Maternal Lab Results:  Group B  Strep negative Other Comments:  None  Review of Systems History   unknown if currently breastfeeding. Exam Physical Exam   Lungs CTAB CV RRR Abd Gravid NT Ext Neg Homans 1+ edema Cx cl/50/-2   Prenatal labs: ABO, Rh: A/Positive/-- (11/22 0000) Antibody: Negative (11/22 0000) Rubella: Immune (11/22 0000) RPR: Nonreactive (11/22 0000)  HBsAg: Negative (11/22 0000)  HIV: Non-reactive (11/22 0000)  GBS:     Assessment/Plan: IUP at [redacted] weeks EGA IVF twins Vtx/ Vtx and concordant growth Oligo with AFI 3% Previous LSTCS for TOLAC. R&B discussed and informed consent obtained. GHTN - mild with no severe sxs A2DM - good control on Gyburide 2.5 q HS.  Monitor in labor Anti E Ab - not significant Plan IOL with pitocin cx ripening/AROM and pit    Turner Daniels 04/30/2021, 5:35 PM

## 2021-04-30 NOTE — Progress Notes (Signed)
Provider notified of pt's BSFS of 175. Pt stated she ate eating two granola (chocolate covered ) bars with in the hour.  Provider stated Q4 hours blood sugar check if patient agrees.

## 2021-05-01 ENCOUNTER — Encounter (HOSPITAL_COMMUNITY): Payer: Self-pay

## 2021-05-01 ENCOUNTER — Inpatient Hospital Stay (HOSPITAL_COMMUNITY): Payer: BC Managed Care – PPO | Admitting: Anesthesiology

## 2021-05-01 ENCOUNTER — Inpatient Hospital Stay (HOSPITAL_COMMUNITY): Payer: BC Managed Care – PPO

## 2021-05-01 ENCOUNTER — Inpatient Hospital Stay (HOSPITAL_COMMUNITY)
Admission: AD | Admit: 2021-05-01 | Payer: BC Managed Care – PPO | Source: Home / Self Care | Admitting: Obstetrics and Gynecology

## 2021-05-01 LAB — GLUCOSE, CAPILLARY
Glucose-Capillary: 76 mg/dL (ref 70–99)
Glucose-Capillary: 95 mg/dL (ref 70–99)
Glucose-Capillary: 81 mg/dL (ref 70–99)
Glucose-Capillary: 72 mg/dL (ref 70–99)
Glucose-Capillary: 77 mg/dL (ref 70–99)

## 2021-05-01 LAB — CBC
HCT: 38.3 % (ref 36.0–46.0)
Hemoglobin: 12.8 g/dL (ref 12.0–15.0)
MCH: 30.7 pg (ref 26.0–34.0)
MCHC: 33.4 g/dL (ref 30.0–36.0)
MCV: 91.8 fL (ref 80.0–100.0)
Platelets: 235 10*3/uL (ref 150–400)
RBC: 4.17 MIL/uL (ref 3.87–5.11)
RDW: 13.1 % (ref 11.5–15.5)
WBC: 8.5 10*3/uL (ref 4.0–10.5)
nRBC: 0 % (ref 0.0–0.2)

## 2021-05-01 LAB — RPR: RPR Ser Ql: NONREACTIVE

## 2021-05-01 MED ORDER — DIPHENHYDRAMINE HCL 50 MG/ML IJ SOLN: 12.5000 mg | INTRAMUSCULAR | Status: DC | PRN

## 2021-05-01 MED ORDER — LACTATED RINGERS IV SOLN
500.0000 mL | Freq: Once | INTRAVENOUS | Status: AC
  Administered 2021-05-01: 500 mL via INTRAVENOUS

## 2021-05-01 MED ORDER — PHENYLEPHRINE 40 MCG/ML (10ML) SYRINGE FOR IV PUSH (FOR BLOOD PRESSURE SUPPORT): 80.0000 ug | PREFILLED_SYRINGE | INTRAVENOUS | Status: DC | PRN

## 2021-05-01 MED ORDER — EPHEDRINE 5 MG/ML INJ: 10.0000 mg | INTRAVENOUS | Status: DC | PRN

## 2021-05-01 MED ORDER — FENTANYL-BUPIVACAINE-NACL 0.5-0.125-0.9 MG/250ML-% EP SOLN
12.0000 mL/h | EPIDURAL | Status: DC | PRN
  Administered 2021-05-01: 12 mL/h via EPIDURAL
  Filled 2021-05-01: qty 250

## 2021-05-01 MED ORDER — LIDOCAINE-EPINEPHRINE (PF) 2 %-1:200000 IJ SOLN
INTRAMUSCULAR | Status: DC | PRN
  Administered 2021-05-01: 4 mL via EPIDURAL

## 2021-05-01 MED ORDER — PHENYLEPHRINE 40 MCG/ML (10ML) SYRINGE FOR IV PUSH (FOR BLOOD PRESSURE SUPPORT)
80.0000 ug | PREFILLED_SYRINGE | INTRAVENOUS | Status: DC | PRN
  Filled 2021-05-01: qty 10

## 2021-05-01 MED ORDER — MENTHOL 3 MG MT LOZG
1.0000 | LOZENGE | OROMUCOSAL | Status: DC | PRN
  Administered 2021-05-01: 3 mg via ORAL
  Filled 2021-05-01: qty 9

## 2021-05-01 NOTE — Progress Notes (Signed)
Patient ID: Rachel Wright, female   DOB: Aug 14, 1986, 35 y.o.   MRN: [id number] Taylah is laughing and states the ctxs are mild  VSSAF  BPs slightly elevated FHR 140s x 2 Ctxs q 4-5 Pit at 8 Miu/Min Cx 1.5/60/-3  AROM clear minimal fluid  Twins at 60 5/7 TOLAC - discussed R&B and consent signed Discussed AROM/Pit and low threshold for Repeat C/S Discussed ok for attempted Delivery in L&D room 217 but will have OR ready and anesthesia MD and CRNA agree and aware. Glc in good control now and will monitor throughout labor

## 2021-05-01 NOTE — Progress Notes (Signed)
Pt stated in her birth plan that she wanted to have limited cervical checks during her labor. She was a direct admit to for induction of labor on 5/16. She was checked during that office visit and was told she was closed and vertex /vertex. The patient refused cervical check for my shift and agreed to have one by Dr. Rana Snare when he come in the morning to assess and possibly break water since she was check not too long before she arrived the hospital. I informed Dr, Perlie Gold about the limited cervical she agreed that there would be no change of plan in care by a cervical check. Dr. Perlie Gold stated she wanted pt to start pit going 1 to 1 max at 34ml/hr.

## 2021-05-01 NOTE — Anesthesia Preprocedure Evaluation (Signed)
Anesthesia Evaluation  Patient identified by MRN, date of birth, ID band Patient awake    Reviewed: Allergy & Precautions, NPO status , Patient's Chart, lab work & pertinent test results  Airway Mallampati: II  TM Distance: >3 FB Neck ROM: Full    Dental no notable dental hx.    Pulmonary neg pulmonary ROS, former smoker,    Pulmonary exam normal breath sounds clear to auscultation       Cardiovascular hypertension (gHTN), Normal cardiovascular exam Rhythm:Regular Rate:Normal     Neuro/Psych PSYCHIATRIC DISORDERS Anxiety Depression negative neurological ROS     GI/Hepatic negative GI ROS, Neg liver ROS,   Endo/Other  diabetes, Gestational  Renal/GU negative Renal ROS  negative genitourinary   Musculoskeletal negative musculoskeletal ROS (+)   Abdominal   Peds  Hematology negative hematology ROS (+)   Anesthesia Other Findings IOL for twins, both vertex. TOLAC. Pregnancy c/b gHTN and gDM  Reproductive/Obstetrics (+) Pregnancy                             Anesthesia Physical Anesthesia Plan  ASA: III  Anesthesia Plan: Epidural   Post-op Pain Management:    Induction:   PONV Risk Score and Plan: Treatment may vary due to age or medical condition  Airway Management Planned: Natural Airway  Additional Equipment:   Intra-op Plan:   Post-operative Plan:   Informed Consent: I have reviewed the patients History and Physical, chart, labs and discussed the procedure including the risks, benefits and alternatives for the proposed anesthesia with the patient or authorized representative who has indicated his/her understanding and acceptance.       Plan Discussed with: Anesthesiologist  Anesthesia Plan Comments: (Patient identified. Risks, benefits, options discussed with patient including but not limited to bleeding, infection, nerve damage, paralysis, failed block, incomplete pain  control, headache, blood pressure changes, nausea, vomiting, reactions to medication, itching, and post partum back pain. Confirmed with bedside nurse the patient's most recent platelet count. Confirmed with the patient that they are not taking any anticoagulation, have any bleeding history or any family history of bleeding disorders. Patient expressed understanding and wishes to proceed. All questions were answered. )        Anesthesia Quick Evaluation

## 2021-05-01 NOTE — Anesthesia Procedure Notes (Signed)
Epidural Patient location during procedure: OB Start time: 05/01/2021 4:10 PM End time: 05/01/2021 4:20 PM  Staffing Anesthesiologist: Elmer Picker, MD Performed: anesthesiologist   Preanesthetic Checklist Completed: patient identified, IV checked, risks and benefits discussed, monitors and equipment checked, pre-op evaluation and timeout performed  Epidural Patient position: sitting Prep: DuraPrep and site prepped and draped Patient monitoring: continuous pulse ox, blood pressure, heart rate and cardiac monitor Approach: midline Location: L3-L4 Injection technique: LOR air  Needle:  Needle type: Tuohy  Needle gauge: 17 G Needle length: 9 cm Needle insertion depth: 6 cm Catheter type: closed end flexible Catheter size: 19 Gauge Catheter at skin depth: 11 cm Test dose: negative  Assessment Sensory level: T8 Events: blood not aspirated, injection not painful, no injection resistance, no paresthesia and negative IV test  Additional Notes Patient identified. Risks/Benefits/Options discussed with patient including but not limited to bleeding, infection, nerve damage, paralysis, failed block, incomplete pain control, headache, blood pressure changes, nausea, vomiting, reactions to medication both or allergic, itching and postpartum back pain. Confirmed with bedside nurse the patient's most recent platelet count. Confirmed with patient that they are not currently taking any anticoagulation, have any bleeding history or any family history of bleeding disorders. Patient expressed understanding and wished to proceed. All questions were answered. Sterile technique was used throughout the entire procedure. Please see nursing notes for vital signs. Test dose was given through epidural catheter and negative prior to continuing to dose epidural or start infusion. Warning signs of high block given to the patient including shortness of breath, tingling/numbness in hands, complete motor block,  or any concerning symptoms with instructions to call for help. Patient was given instructions on fall risk and not to get out of bed. All questions and concerns addressed with instructions to call with any issues or inadequate analgesia.  Reason for block:procedure for pain

## 2021-05-02 LAB — CBC
HCT: 32.3 % — ABNORMAL LOW (ref 36.0–46.0)
Hemoglobin: 10.8 g/dL — ABNORMAL LOW (ref 12.0–15.0)
MCH: 30.6 pg (ref 26.0–34.0)
MCHC: 33.4 g/dL (ref 30.0–36.0)
MCV: 91.5 fL (ref 80.0–100.0)
Platelets: 207 10*3/uL (ref 150–400)
RBC: 3.53 MIL/uL — ABNORMAL LOW (ref 3.87–5.11)
RDW: 13.2 % (ref 11.5–15.5)
WBC: 13.6 10*3/uL — ABNORMAL HIGH (ref 4.0–10.5)
nRBC: 0 % (ref 0.0–0.2)

## 2021-05-02 MED ORDER — ZOLPIDEM TARTRATE 5 MG PO TABS: 5.0000 mg | ORAL_TABLET | Freq: Every evening | ORAL | Status: DC | PRN

## 2021-05-02 MED ORDER — COCONUT OIL OIL
1.0000 "application " | TOPICAL_OIL | Status: DC | PRN
  Administered 2021-05-02: 1 via TOPICAL

## 2021-05-02 MED ORDER — IBUPROFEN 600 MG PO TABS
600.0000 mg | ORAL_TABLET | Freq: Four times a day (QID) | ORAL | Status: DC
  Administered 2021-05-02 – 2021-05-03 (×6): 600 mg via ORAL
  Filled 2021-05-02 (×6): qty 1

## 2021-05-02 MED ORDER — ACETAMINOPHEN 325 MG PO TABS: 650.0000 mg | ORAL_TABLET | ORAL | Status: DC | PRN

## 2021-05-02 MED ORDER — BENZOCAINE-MENTHOL 20-0.5 % EX AERO
1.0000 "application " | INHALATION_SPRAY | CUTANEOUS | Status: DC | PRN
  Administered 2021-05-02: 1 via TOPICAL
  Filled 2021-05-02: qty 56

## 2021-05-02 MED ORDER — OXYCODONE-ACETAMINOPHEN 5-325 MG PO TABS
2.0000 | ORAL_TABLET | ORAL | Status: DC | PRN
Start: 2021-05-02 — End: 2021-05-03

## 2021-05-02 MED ORDER — ONDANSETRON HCL 4 MG PO TABS: 4.0000 mg | ORAL_TABLET | ORAL | Status: DC | PRN

## 2021-05-02 MED ORDER — MEDROXYPROGESTERONE ACETATE 150 MG/ML IM SUSP: 150.0000 mg | INTRAMUSCULAR | Status: DC | PRN

## 2021-05-02 MED ORDER — WITCH HAZEL-GLYCERIN EX PADS: 1.0000 "application " | MEDICATED_PAD | CUTANEOUS | Status: DC | PRN

## 2021-05-02 MED ORDER — TETANUS-DIPHTH-ACELL PERTUSSIS 5-2.5-18.5 LF-MCG/0.5 IM SUSY: 0.5000 mL | PREFILLED_SYRINGE | Freq: Once | INTRAMUSCULAR | Status: DC

## 2021-05-02 MED ORDER — PRENATAL MULTIVITAMIN CH
1.0000 | ORAL_TABLET | Freq: Every day | ORAL | Status: DC
  Administered 2021-05-02 – 2021-05-03 (×2): 1 via ORAL
  Filled 2021-05-02 (×2): qty 1

## 2021-05-02 MED ORDER — MEASLES, MUMPS & RUBELLA VAC IJ SOLR: 0.5000 mL | Freq: Once | INTRAMUSCULAR | Status: DC

## 2021-05-02 MED ORDER — DIPHENHYDRAMINE HCL 25 MG PO CAPS: 25.0000 mg | ORAL_CAPSULE | Freq: Four times a day (QID) | ORAL | Status: DC | PRN

## 2021-05-02 MED ORDER — SENNOSIDES-DOCUSATE SODIUM 8.6-50 MG PO TABS
2.0000 | ORAL_TABLET | Freq: Every day | ORAL | Status: DC
  Administered 2021-05-02 – 2021-05-03 (×2): 2 via ORAL
  Filled 2021-05-02 (×2): qty 2

## 2021-05-02 MED ORDER — DIBUCAINE (PERIANAL) 1 % EX OINT: 1.0000 "application " | TOPICAL_OINTMENT | CUTANEOUS | Status: DC | PRN

## 2021-05-02 MED ORDER — SIMETHICONE 80 MG PO CHEW: 80.0000 mg | CHEWABLE_TABLET | ORAL | Status: DC | PRN

## 2021-05-02 MED ORDER — ONDANSETRON HCL 4 MG/2ML IJ SOLN: 4.0000 mg | INTRAMUSCULAR | Status: DC | PRN

## 2021-05-02 MED ORDER — OXYCODONE-ACETAMINOPHEN 5-325 MG PO TABS
1.0000 | ORAL_TABLET | ORAL | Status: DC | PRN
Start: 2021-05-02 — End: 2021-05-03

## 2021-05-02 NOTE — Lactation Note (Signed)
This note was copied from a baby's chart. Lactation Consultation Note Baby latched well after delivery. Had been rooting since birth and has BF well. Baby BF well at intervals. Discussed ETI and supplementing babies less than 6 lbs, pumping, and building milk supply. Discussed supplementing w/mom's milk first but if needed more Donor milk suggested. FOB stated they would use formula because they didn't want the babies possibly exposded to the Covid vaccines.  Will f/u on MBU.  Patient Name: Rachel Wright [id number] Date: 05/02/2021 Reason for consult: L&D Initial assessment;Early term 37-38.6wks;Multiple gestation Age:2 hours  Maternal Data    Feeding    LATCH Score Latch: Grasps breast easily, tongue down, lips flanged, rhythmical sucking.  Audible Swallowing: None  Type of Nipple: Everted at rest and after stimulation  Comfort (Breast/Nipple): Soft / non-tender  Hold (Positioning): Assistance needed to correctly position infant at breast and maintain latch.  LATCH Score: 7   Lactation Tools Discussed/Used    Interventions Interventions: Assisted with latch;Support pillows;Skin to skin;Breast massage  Discharge WIC Program: No  Consult Status Consult Status: Follow-up Date: 05/02/21 Follow-up type: In-patient    Charyl Dancer 05/02/2021, 12:17 AM

## 2021-05-02 NOTE — Social Work (Signed)
CSW received consult for hx of Anxiety and Postpartum Depression.  CSW met with MOB to offer support and complete assessment.     CSW introduced self and role. CSW observed infant's sleeping in bassinet and MOB walking around room. CSW informed MOB of reason for consult. MOB reported she is currently doing well. MOB stated she was diagnosed with anxiety and PPD in 2020 following the birth of her daughter. MOB shared the PPD started when she returned to work, three months postpartum. MOB disclosed she is prescribed Zoloft 26m, which is helpful. MOB stated she also attended therapy for a short time in 2020. MOB identified FOB and friends as supports. MOB denies any current SI, HI or being involved in DV.   CSW provided education regarding the baby blues period versus PPD and offered resources. MOB declined and stated she has contact information for her prevous therapist. CSW provided the New Mom Checklist and encouraged MOB to self evaluate and contact a medical professional if symptoms are noted at any time.  CSW provided review of Sudden Infant Death Syndrome (SIDS) precautions.  MOB has everything needed for infant. MOB denies any barriers to follow-up care.  CSW identifies no further need for intervention and no barriers to discharge at this time.  CDarra Lis LNavesinkWork WEnterprise Productsand CMolson [employer]([phone]

## 2021-05-02 NOTE — Progress Notes (Signed)
Post Partum Day 1 di di twin girls, IOL gHTN and oligo Subjective: no complaints, up ad lib and tolerating PO  Objective: Blood pressure 133/73, pulse 79, temperature 98.6 F (37 C), temperature source Oral, resp. rate 18, SpO2 97 %, unknown if currently breastfeeding.  Physical Exam:  General: alert Lochia: appropriate Uterine Fundus: firm Incision: n/a DVT Evaluation: No evidence of DVT seen on physical exam.  Recent Labs    05/01/21 1251 05/02/21 0517  HGB 12.8 10.8*  HCT 38.3 32.3*    Assessment/Plan: Plan for discharge tomorrow and Breastfeeding Baby girls in room and doing well. BP normal.    LOS: 2 days   Ranae Pila 05/02/2021, 8:50 AM

## 2021-05-02 NOTE — Lactation Note (Signed)
This note was copied from a baby's chart. Lactation Consultation Note Baby 4 hrs old. Babies has low glucose levels. RN notified LC. Mom doesn't want to give formula at this time. Wants to try to given EBM. DEBP set up, mom pumped not collecting anything. Then mom hand expressed a few dots of colostrum in a spoon. Baby A wanting to suckle continuously. Gave those drops to her. Asked mom if she was going to give the formula since their sugar is low, mom stated she's going to wait and see what the next sugar is going to be.  Mom put the baby back on the breast.  Gave mom LPI information about not feeding longer than 30 minutes d/t burning calories which can drop sugar more.  discussed how much supplementation is needed according to hours of age. Encouraged mom to call for assistance or question. Lactation brochure given.  Patient Name: Rachel Wright [id number] Date: 05/02/2021 Reason for consult: Initial assessment;Early term 37-38.6wks;Multiple gestation Age:65 hours  Maternal Data Has patient been taught Hand Expression?: Yes  Feeding    LATCH Score Latch: Grasps breast easily, tongue down, lips flanged, rhythmical sucking.  Audible Swallowing: None  Type of Nipple: Everted at rest and after stimulation  Comfort (Breast/Nipple): Soft / non-tender  Hold (Positioning): Full assist, staff holds infant at breast  LATCH Score: 6   Lactation Tools Discussed/Used Tools: Pump Breast pump type: Double-Electric Breast Pump Pump Education: Setup, frequency, and cleaning;Milk Storage Reason for Pumping: twins/less than 6 lbs Pumping frequency: Q3 Pumped volume: 0 mL  Interventions Interventions: Breast feeding basics reviewed;Support pillows;DEBP  Discharge WIC Program: No  Consult Status Consult Status: Follow-up Date: 05/02/21 Follow-up type: In-patient    Charyl Dancer 05/02/2021, 3:38 AM

## 2021-05-02 NOTE — Lactation Note (Signed)
This note was copied from a baby's chart. Lactation Consultation Note  Patient Name: Rachel Wright Date: 05/02/2021 Reason for consult: Follow-up assessment;Mother's request;Difficult latch;Primapara;Multiple gestation;Early term 37-38.6wks;Infant < 6lbs Age:35 hours  Maternal Data    Infant B not able to sustain latch with suck training and on and off the breast during feedings. Infant high palate and thick labial attachment even with flanging of bottom lip suck improves for a few and then diminishes. LC used 5 french primed with formula and syringe, infant able to take 9 ml but slow with some leakage during the feeding.   Infant, without the volume from the 5 french with formula, will do a few sucks with breast compression but not consistently. They were some deep swallows noted with the latch at the breast.   LC talked with Mom to assess infant feeding with artificial nipple given leakage noted above with feeding and weak latch. Mom agreed.   Infant B also showed feeding cues after latching Mom offered more formula with curve tip 3 ml.   Plan 1. To feed based on cues 8-12x in 24 hr period no more than 3 hrs without an attempt. Mom to latch at the breast and offer supplementation of EBM first then formula using 5 french and syringe based on supplementation guidelines for late preterm based on hrs of age since delivery. Mom encouraged to offer more if infant still shows hunger cues. Baby B may transfer more with curve tip and finger feeding than 5 french           2. Mom agreed to supplement Baby B with next feeding with artificial nipple and paced bottle feeding to assess infant ability to coordinate a good suck. Mom aware of LPTI breastfeeding supplementation volumes and to offer more if infant still cues. Mom to keep total feeding under 30 minutes.           3.  Mom to pump with DEBP q 3 hrs for 15 minutes.           4. I and O sheet reviewed.            5. LC brochure of  inpatient and outpatient services reviewed.     Feeding Mother's Current Feeding Choice: Breast Milk and Formula  LATCH Score Latch: Repeated attempts needed to sustain latch, nipple held in mouth throughout feeding, stimulation needed to elicit sucking reflex.  Audible Swallowing: A few with stimulation  Type of Nipple: Everted at rest and after stimulation  Comfort (Breast/Nipple): Soft / non-tender  Hold (Positioning): Assistance needed to correctly position infant at breast and maintain latch.  LATCH Score: 7   Lactation Tools Discussed/Used Tools: 62F feeding tube / Syringe;Pump;Flanges Flange Size: 24 Breast pump type: Double-Electric Breast Pump Reason for Pumping: increase stimulation Pumping frequency: every 3 hrs for 15 minutes  Interventions Interventions: Breast feeding basics reviewed;Support pillows;Education;Assisted with latch;Skin to skin;Expressed milk;Hand express;Coconut oil;DEBP;Breast compression;Adjust position  Discharge Pump: Personal WIC Program: No  Consult Status Consult Status: Follow-up Date: 05/03/21 Follow-up type: In-patient    Rachel Briles  Wright 05/02/2021, 2:01 PM

## 2021-05-02 NOTE — Lactation Note (Signed)
This note was copied from a baby's chart. Lactation Consultation Note  Patient Name: Rachel Wright [id number] Date: 05/02/2021 Reason for consult: Follow-up assessment;Mother's request;Multiple gestation;Early term 37-38.6wks;Infant < 6lbs Age:35 hours   Infant A high palate with thick labial attachment. LC did some suck training able to feel tongue at base of finger. Infant did not extend tongue pass the gumline during feeding assessment. Infant tuck in bottom lip and with a chin tug able to get a better latch and improve milk transfer.   Infant continued to transfer from the breast with removal of 5 french primed with Similac Neosure 22 cal/oz. Total amount of formula given with 5 french 43ml. Dad holding baby A still cueing so he gave 2 ml more of formula with curve tip and finger feeding.   Mom encouraged to pump q 3hrs for 15 minutes after latching to assess her volume given infant still hungry after latching at the breast and taking 11 ml of formula.   RN, Rachel Wright talked to [facility] about differences in blood type and need to increase volume of feeding as they observe bilirubin levels.   Plan 1. To feed based on cues 8-12x in 24 hr period no more than 3 hrs without an attempt. Mom to latch at the breast and offer supplementation of EBM first then formula using 5 french and syringe based on supplementation guidelines for late preterm based on hrs of age since delivery. Mom encouraged to offer more if infant still shows hunger cues.           2. Mom to pump with DEBP q 3 hrs for 15 minutes.            3. I and O sheet reviewed.            4. LC brochure of inpatient and outpatient services reviewed.   LC talked to RN, Rachel Wright about findings above.  Will talk to Mom about paced bottle feeding in order to get more volume at the next feeding.   Maternal Data Has patient been taught Hand Expression?: Yes  Feeding Mother's Current Feeding Choice: Breast Milk and  Formula  LATCH Score Latch: Repeated attempts needed to sustain latch, nipple held in mouth throughout feeding, stimulation needed to elicit sucking reflex.  Audible Swallowing: Spontaneous and intermittent  Type of Nipple: Everted at rest and after stimulation  Comfort (Breast/Nipple): Soft / non-tender  Hold (Positioning): Assistance needed to correctly position infant at breast and maintain latch.  LATCH Score: 8   Lactation Tools Discussed/Used Tools: 67F feeding tube / Syringe;Pump;Flanges;Coconut oil Flange Size: 24 Breast pump type: Double-Electric Breast Pump Pumping frequency: every 3 hrs for 15 minutes  Interventions Interventions: Breast feeding basics reviewed;Support pillows;Education;Assisted with latch;Skin to skin;Expressed milk;Hand express;Breast compression;DEBP;Adjust position;Coconut oil  Discharge Pump: Personal WIC Program: No  Consult Status Consult Status: Follow-up Date: 05/03/21 Follow-up type: In-patient    Rachel Wright  Rachel Wright 05/02/2021, 1:39 PM

## 2021-05-02 NOTE — Lactation Note (Signed)
This note was copied from a baby's chart. Lactation Consultation Note Baby less than hr old. Baby has latched on since she was born. BF great. Mom has good everted nipples. Experienced BF mom mom BF then mostly pumped for 1 yr. Mom stated her now 35 yr old daughter had a lip tie and it was discovered at 29 months old.  Encouraged to BF one baby at a time for training the babies how to feed and focus on each one and their latching. Discussed alternating breast w/each baby as well.  Will f/u on MBU.  Patient Name: [name] [id number]'V Date: 05/02/2021 Reason for consult: L&D Initial assessment;Early term 37-38.6wks;Multiple gestation Age:72 hours  Maternal Data    Feeding    LATCH Score Latch: Grasps breast easily, tongue down, lips flanged, rhythmical sucking.  Audible Swallowing: None  Type of Nipple: Everted at rest and after stimulation  Comfort (Breast/Nipple): Soft / non-tender  Hold (Positioning): No assistance needed to correctly position infant at breast.  LATCH Score: 8   Lactation Tools Discussed/Used    Interventions Interventions: Breast feeding basics reviewed;Breast compression  Discharge WIC Program: No  Consult Status Consult Status: Follow-up Date: 05/02/21 Follow-up type: In-patient    Myla Mauriello, Diamond Nickel 05/02/2021, 12:09 AM

## 2021-05-02 NOTE — Anesthesia Postprocedure Evaluation (Signed)
Anesthesia Post Note  Patient: Rachel Wright  Procedure(s) Performed: AN AD HOC LABOR EPIDURAL     Patient location during evaluation: Mother Baby Anesthesia Type: Epidural Level of consciousness: awake Pain management: satisfactory to patient Vital Signs Assessment: post-procedure vital signs reviewed and stable Respiratory status: spontaneous breathing Cardiovascular status: stable Anesthetic complications: no   No complications documented.  Last Vitals:  Vitals:   05/02/21 0224 05/02/21 0622  BP: 110/76 133/73  Pulse: (!) 109 79  Resp: 18 18  Temp: 37.2 C 37 C  SpO2: 98% 97%    Last Pain:  Vitals:   05/02/21 0735  TempSrc:   PainSc: Asleep   Pain Goal:                   Cephus Shelling

## 2021-05-03 LAB — SURGICAL PATHOLOGY

## 2021-05-03 MED ORDER — IBUPROFEN 600 MG PO TABS: 600.0000 mg | ORAL_TABLET | Freq: Four times a day (QID) | ORAL | 0 refills | Status: AC

## 2021-05-03 MED ORDER — ACETAMINOPHEN 325 MG PO TABS: 650.0000 mg | ORAL_TABLET | ORAL | 0 refills | Status: AC | PRN

## 2021-05-03 NOTE — Progress Notes (Signed)
Postpartum Progress Note  Post Partum Day 2 s/p spontaneous vaginal delivery, VBAC for Di-Di twins.  Patient reports well-controlled pain, ambulating without difficulty, voiding spontaneously, tolerating PO.  Vaginal bleeding is appropriate.   Objective: Blood pressure 135/86, pulse 70, temperature 97.9 F (36.6 C), temperature source Oral, resp. rate 18, SpO2 99 %, unknown if currently breastfeeding.  Physical Exam:  General: alert and no distress Lochia: appropriate Uterine Fundus: firm DVT Evaluation: No evidence of DVT seen on physical exam.  Recent Labs    05/01/21 1251 05/02/21 0517  HGB 12.8 10.8*  HCT 38.3 32.3*    Assessment/Plan: . Postpartum Day 2, s/p vaginal delivery. Marland Kitchen GHTN: BP normal range since admission . Continue routine postpartum care . Lactation following . Anticipate discharge home today   LOS: 3 days   Rachel Wright 05/03/2021, 7:31 AM

## 2021-05-03 NOTE — Discharge Summary (Signed)
Obstetric Discharge Summary  Rachel Wright is a 35 y.o. 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  She was admitted to labor and delivery for her induction and she delivered two baby girls on 05/01/21 via VBAC with vacuum assistance for each.  Her postpartum course was uncomplicated and on PPD#2, she reported well controlled pain, spontaneous voiding, ambulating without difficulty, and tolerating PO.  She was stable for discharge home on 05/03/21 with plans for in-office follow up.  Hemoglobin  Date Value Ref Range Status  05/02/2021 10.8 (L) 12.0 - 15.0 g/dL Final   HCT  Date Value Ref Range Status  05/02/2021 32.3 (L) 36.0 - 46.0 % Final    Physical Exam:  General: alert and no distress Lochia: appropriate Uterine Fundus: firm DVT Evaluation: No evidence of DVT seen on physical exam.  Discharge Diagnoses: VBAC, twins, term  Discharge Information: Date: 05/03/2021 Activity: Pelvic rest, as tolerated Diet: routine Medications: Tylenol, motrin Condition: stable Instructions: Refer to practice specific booklet.  Discussed prior to discharge.  Discharge to: Home  Follow-up Information    [place] Follow up.   Why: Please follow up for 6 week postpartum visit.  Contact information: [address] [place] [address] [phone]               Newborn Data:   Ladene, Allocca [[id number]]  Live born female  Birth Weight: 5 lb 4 oz (2381 g) APGAR: 8, 9  Newborn Delivery   Birth date/time: 05/01/2021 22:53:00 Delivery type: VBAC, Vacuum Assisted       Nicle, Connole [[id number]]  Live born female  Birth Weight: 4 lb 14.5 oz (2225 g) APGAR: 9, 9  Newborn Delivery   Birth date/time: 05/01/2021 23:02:00 Delivery type: VBAC, Vacuum Assisted      Home  with mother.  Lyn Henri 05/03/2021, 8:40 AM

## 2021-05-04 LAB — TYPE AND SCREEN
ABO/RH(D): A POS
Antibody Screen: POSITIVE
Donor AG Type: NEGATIVE
Donor AG Type: NEGATIVE
Unit division: 0
Unit division: 0

## 2021-05-04 LAB — BPAM RBC
Blood Product Expiration Date: 202206022359
Blood Product Expiration Date: 202206082359
Unit Type and Rh: 6200
Unit Type and Rh: 6200

## 2021-09-27 IMAGING — US US MFM OB DETAIL+14 WK
1 series · 11 of 28 positions shown · non-contrast
Comparison: none

[Series 1: us mfm ob detail+14 wk · 11 of 179 slices shown]
[im 7/179]
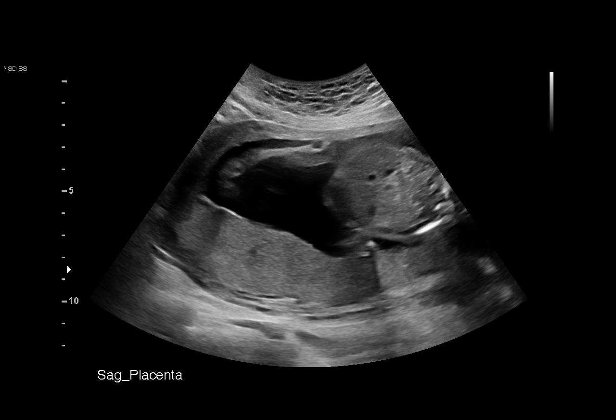
[im 20/179]
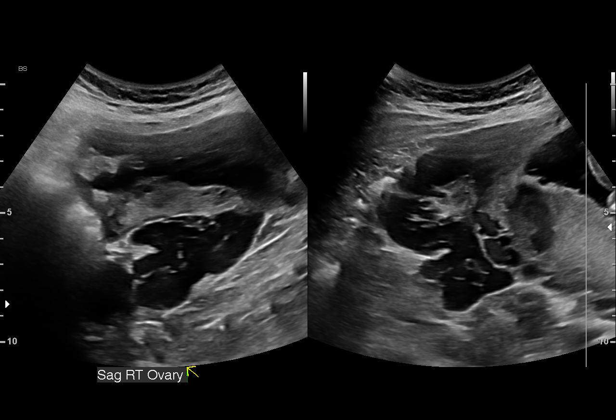
[im 40/179]
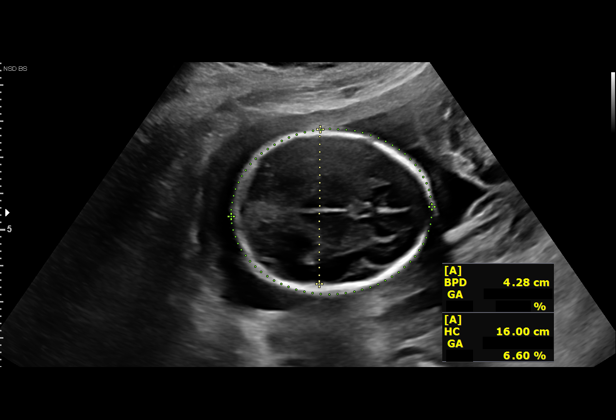
[im 53/179]
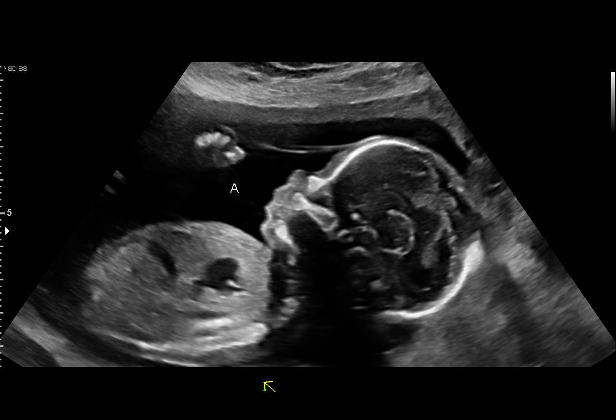
[im 73/179]
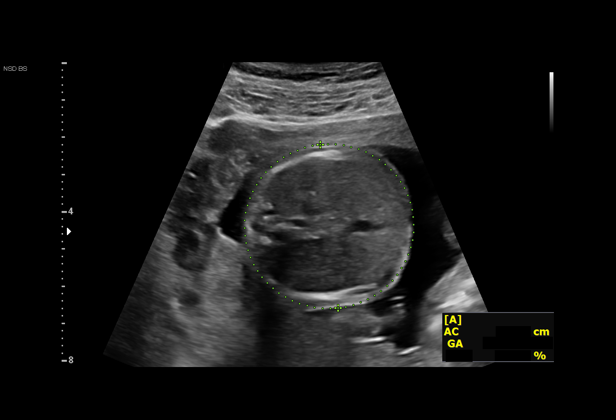
[im 93/179]
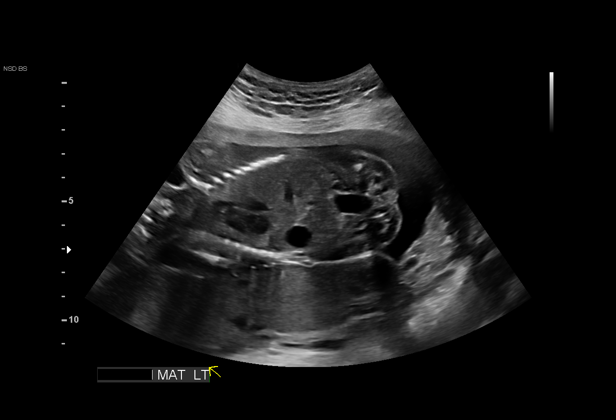
[im 106/179]
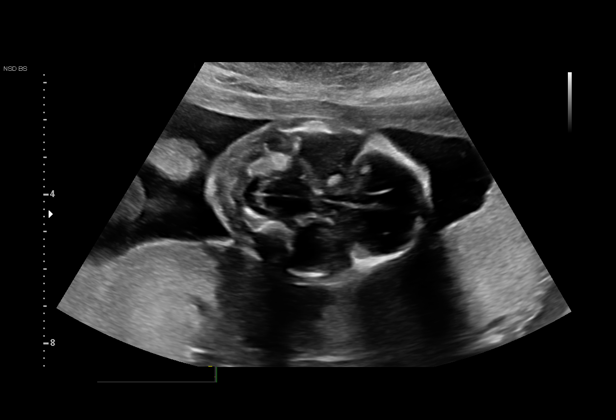
[im 126/179]
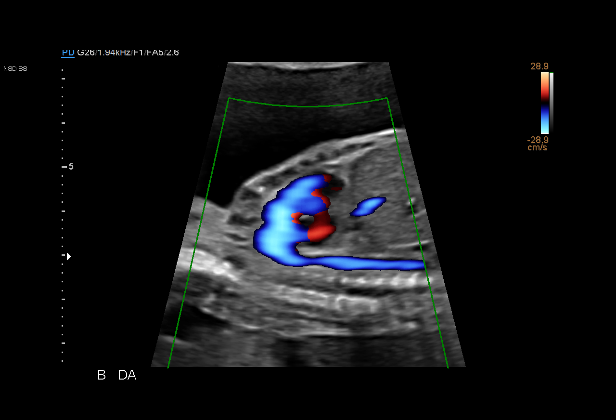
[im 139/179]
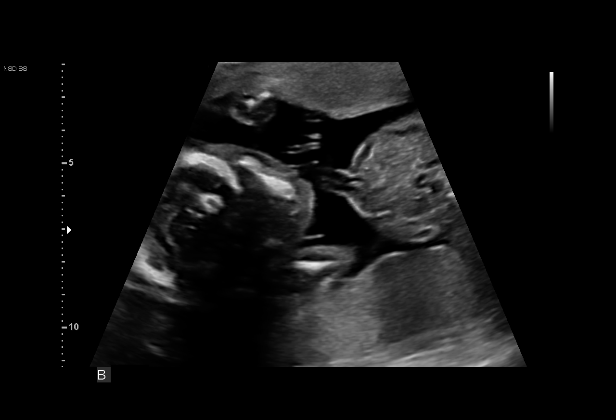
[im 159/179]
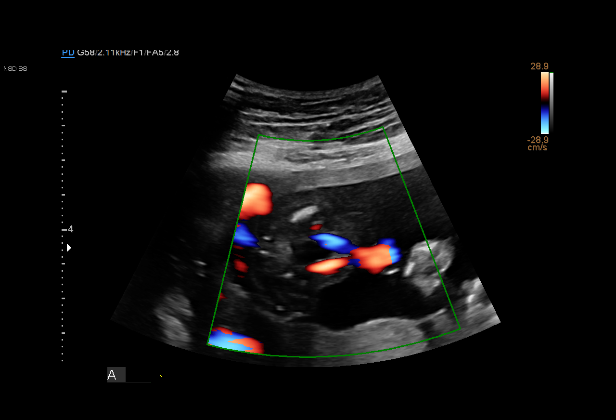
[im 172/179]
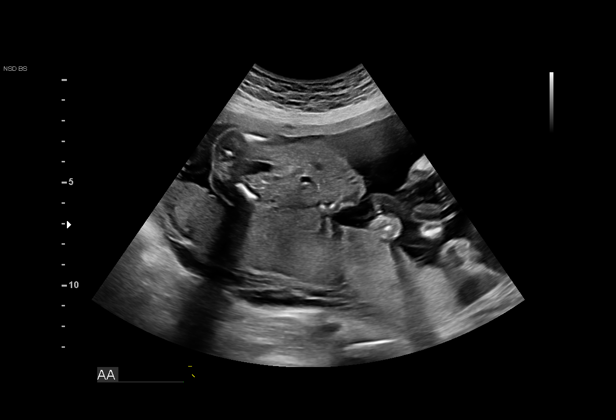

[11 of 28 positions shown; findings below may reference images not displayed]

[REDACTED]. [HOSPITAL]
                   DO

 2  US MFM OB DETAIL ADDL GEST            76811.02    KAREMULLA BRAHIM
    +14 WK

Indications

 Anti-E isoimmunization affecting pregnancy
 in 2nd trimester (too weak to titer)
 19 weeks gestation of pregnancy
 Twin pregnancy, Erxleben/Vonte, second trimester
 Previous cesarean delivery, antepartum
 (Breech and SROM)
 Pregnancy resulting from assisted
 reproductive technology (IVF Donor egg)
 Encounter for antenatal screening for
 malformations
Fetal Evaluation (Fetus A)

 Num Of Fetuses:         2
 Fetal Heart Rate(bpm):  152
 Cardiac Activity:       Observed
 Fetal Lie:              Maternal right side
 Presentation:           Cephalic
 Placenta:               Posterior
 P. Cord Insertion:      Velamentous insertion
 Membrane Desc:      Dividing Membrane seen

 Amniotic Fluid
 AFI FV:      Within normal limits

                             Largest Pocket(cm)

Biometry (Fetus A)

 BPD:        43  mm     G. Age:  19w 0d         17  %    CI:        72.52   %    70 - 86
                                                         FL/HC:      18.1   %    16.8 -
 HC:      160.6  mm     G. Age:  18w 6d          7  %    HC/AC:      1.16        1.09 -
 AC:      138.7  mm     G. Age:  19w 2d         26  %    FL/BPD:     67.4   %
 FL:         29  mm     G. Age:  18w 6d         14  %    FL/AC:      20.9   %    20 - 24
 HUM:        28  mm     G. Age:  19w 0d         31  %
 CER:      19.7  mm     G. Age:  19w 1d         43  %
 NFT:       3.9  mm
 LV:        6.4  mm
 CM:        3.7  mm

 Est. FW:     273  gm    0 lb 10 oz      12  %     FW Discordancy         8  %
OB History

 Gravidity:    2         Term:   1        Prem:   0        SAB:   0
 TOP:          0       Ectopic:  0        Living: 1
Gestational Age (Fetus A)

 U/S Today:     19w 0d                                        EDD:   05/23/21
 Best:          19w 6d     Det. By:  Early Ultrasound         EDD:   05/17/21
Anatomy (Fetus A)

 Cranium:               Appears normal         LVOT:                   Appears normal
 Cavum:                 Appears normal         Aortic Arch:            Appears normal
 Ventricles:            Appears normal         Ductal Arch:            Appears normal
 Choroid Plexus:        Appears normal         Diaphragm:              Appears normal
 Cerebellum:            Appears normal         Stomach:                Appears normal, left
                                                                       sided
 Posterior Fossa:       Appears normal         Abdomen:                Appears normal
 Nuchal Fold:           Appears normal         Abdominal Wall:         Appears nml (cord
                                                                       insert, abd wall)
 Face:                  Appears normal         Kidneys:                Appear normal
                        (orbits and profile)
 Lips:                  Appears normal         Bladder:                Appears normal
 Palate:                Not well visualized    Spine:                  Appears normal
 Thoracic:              Appears normal         Upper Extremities:      Appears normal
 Heart:                 Appears normal         Lower Extremities:      Appears normal
                        (4CH, axis, and
                        situs)
 RVOT:                  Appears normal

 Other:  Parents do not wish to know sex of fetus. Heels and 5th digit
         visualized. Nasal bone visualized.

Fetal Evaluation (Fetus B)

 Num Of Fetuses:         2
 Fetal Heart Rate(bpm):  160
 Cardiac Activity:       Observed
 Fetal Lie:              Maternal left side
 Presentation:           Breech
 Placenta:               Posterior
 P. Cord Insertion:      Visualized
 Membrane Desc:      Dividing Membrane seen

 Amniotic Fluid
 AFI FV:      Within normal limits

                             Largest Pocket(cm)

Biometry (Fetus B)

 BPD:      42.8  mm     G. Age:  19w 0d         16  %    CI:        71.86   %    70 - 86
                                                         FL/HC:      18.0   %    16.8 -
 HC:      160.7  mm     G. Age:  18w 6d          8  %    HC/AC:      1.07        1.09 -
 AC:      149.6  mm     G. Age:  20w 1d         57  %    FL/BPD:     67.8   %
 FL:         29  mm     G. Age:  18w 6d         14  %    FL/AC:      19.4   %    20 - 24
 HUM:      29.4  mm     G. Age:  19w 4d         46  %
 CER:        20  mm     G. Age:  19w 2d         50  %
 NFT:       4.8  mm

 LV:        6.1  mm
 CM:        4.8  mm

 Est. FW:     297  gm    0 lb 10 oz      27  %     FW Discordancy      0 \ 8 %
Gestational Age (Fetus B)

 U/S Today:     19w 2d                                        EDD:   05/21/21
 Best:          19w 6d     Det. By:  Early Ultrasound         EDD:   05/17/21
Anatomy (Fetus B)

 Cranium:               Appears normal         LVOT:                   Appears normal
 Cavum:                 Appears normal         Aortic Arch:            Appears normal
 Ventricles:            Appears normal         Ductal Arch:            Appears normal
 Choroid Plexus:        Appears normal         Diaphragm:              Appears normal
 Cerebellum:            Appears normal         Stomach:                Appears normal, left
                                                                       sided
 Posterior Fossa:       Appears normal         Abdomen:                Appears normal
 Nuchal Fold:           Appears normal         Abdominal Wall:         Appears nml (cord
                                                                       insert, abd wall)
 Face:                  Appears normal         Cord Vessels:           Appears normal (3
                        (orbits and profile)                           vessel cord)
 Lips:                  Appears normal         Kidneys:                Appear normal
 Palate:                Not well visualized    Bladder:                Appears normal
 Thoracic:              Appears normal         Spine:                  Appears normal
 Heart:                 Appears normal         Upper Extremities:      Appears normal
                        (4CH, axis, and
                        situs)
 RVOT:                  Appears normal         Lower Extremities:      Appears normal

 Other:  Parents do not wish to know sex of fetus.
Cervix Uterus Adnexa

 Cervix
 Length:           3.65  cm.
 Normal appearance by transabdominal scan.
 Right Ovary
 Within normal limits.

 Left Ovary
 Within normal limits.

 Adnexa
 No abnormality visualized.
Impression

 We confirmed monochorionic-diamniotic twin pregnancy.

 Twin A: Maternal right, cephalic presentation, posterior
 placenta. Velamentous/marginal cord insertion is seen.
 Amniotic fluid is normal and good fetal activity is seen. Fetal
 bladder is seen well. Fetal biometry is consistent with her
 previously established dates. No markers of fetal
 aneuploidies or structural defects are seen.

 Twin B: Maternal left, breech presentation, posterior placenta.
 Amniotic fluid is normal and good fetal activity is seen. Fetal
 bladder is seen well. Fetal biometry is consistent with her
 previously established dates. No markers of fetal
 aneuploidies or structural defects are seen.

 Growth discordancy: 8% (normal).

 No evidence of twin-to-twin transfusion syndrome (TTTS).
 [id number]xxxxxxxx
 xxxxxxxxxxxxxxxxxxxxxxxxxxxxxxxxx
 Consultation Note (from [REDACTED])
 I had the pleasure of seeing Ms. Don Lolito Onacram today at
 gestation and is here for fetal anatomy scan and consultation.
 Her high-risk pregnancy problems include:
 -Monochorionic-diamniotic twin pregnancy.
 -Anti-E antibodies.
 -Previous cesarean delivery.
 -Velamentous cord/marginal cord insertion

 Patient had IVF and one embryo transfer (day 5).
 Monochorionic-diamniotic twin pregnancy was confirmed on
 early pregnancy scan. On first-trimester screening, the risks
 for fetal aneuploidies are not increased. She reports her
 blood pressures at prenatal visits were within normal range.

 Obstetric history is significant for a term cesarean delivery
 (breech presentation).  Patient reports her pregnancy was
 also complicated by gestational hypertension.  She did not
 have gestational diabetes.
 GYN history: No history of abnormal Pap smears or cervical
 surgeries.  She had breast biopsy that was reported as
 benign.  Her previous menstrual cycles were regular.

 Past medical history: No history of hypertension or diabetes
 or any other chronic medical conditions.
 Past surgical history: Cesarean section.
 Medications: Low-dose aspirin, prenatal vitamins.
 Allergies: Ceclor (?) "rashes."
 Social history: Denies tobacco or drug or alcohol use.  She
 has been married for years and her husband is in good
 health.  He is Caucasian.
 Family history: Mother has a history of deep vein thrombosis.
 No history of diabetes or hypertension.

 Her blood pressures today at our office were 144/90- and
 140/92-mm Hg.

 Our concerns include:
 Monochorionic-diamniotic twin pregnancy
 -Explained chorionicity and its implications.
 -Monochorionic twins have a higher rate of complications
 including congenital malformations, twin to twin transfusion
 syndrome (TTTS) (15%), selective growth restriction, and
 fetal demise of one or both twins.
 -Twin pregnancies are associated with increased likelihood of
 gestational diabetes, gestational hypertension, or
 preeclampsia, malpresentations, cesarean delivery and
 postpartum hemorrhage.
 -Preterm delivery is the most-common complication of twin
 pregnancies.
 -I discussed ultrasound surveillance for TTTS every 2 weeks
 from 16 weeks until delivery.
 -I discussed timing and mode of delivery. We recommend
 delivery at 37 weeks in monochorionic twins to prevent the
 likelihood of stillbirth of one or both twins that is increased in
 monochorionic twins. Earlier delivery may be indicated if this
 pregnancy is complicated by fetal growth restriction or other
 maternal complications.
 -In cephalic/cephalic presentations, vaginal delivery can be
 safely undertaken. In non-vertex presentation of twin A, we
 recommend cesarean delivery. Patient had previous
 cesarean section. She can be counseled on VBAC or elective
 cesarean section at your office in the third trimester. I did not
 counsel on internal podalic version of non-vertex second twin.
 -Monochorionic twins are more common in IVF pregnancies.
 Low-dose aspirin is beneficial in preventing or delaying
 gestational hypertension/preeclampsia. Patient is already
 taking low-dose aspirin.

 Anti-E antibodies
 RhE antigen belongs to Rh group of antigens. Patient's blood
 type is A Positive. Anti-E antibodies can have additive effect if
 the patient has anti-D antibodies in causing fetal hemolysis.
 Her current antibody titers are too weak to titer and is unlikely
 to cause fetal hemolysis.
 I recommend repeating titers in 4 to 6 weeks. If titers are
 increasing ([DATE] or higher), we recommend serial middle-
 cerebral artery Doppler studies.
 I also discussed screening her husband for E antigen. If he is
 E negative, fetal surveillance for anemia is not necessary.
 Since patient did not have blood transfusions, it is more likely
 that her first pregnancy (her daughter with positive E antigen)
 led to anti-E antibodies.

 Velamentous cord insertion/marginal cord insertion
 It was difficult to rule out velamentous cord insertion. I
 explained the finding with the help of a diagram. I informed
 her that the location is not near the cervix and there is no
 evidence of vasa previa. Velamentous cord insertion can be
 associated with fetal growth restriction in some cases.
 Generally, they are not associated with adverse outcomes
 including rupture of the vessels (more common with vasa
 previa).

 Previous cesarean delivery
 We briefly discussed VBAC (with favorable presentation) and
 the option of elective cesarean delivery. Patient should be
 counseled close to delivery based on the presentations.
 There was no evidence of previa on today's ultrasound.
Recommendations

 -Ultrasound every 2 weeks for twin-to-twin transfusion
 surveillance.
 -Fetal growth assessment every 4 weeks.
 -Weekly BPP from 32 weeks till delivery.
 -Delivery at 37 weeks gestation provided antenatal testing is
 reassuring and no other complications exist.
 --We have requested an appointment for fetal
 echocardiography ([facility]ident[facility]).
 -Placental cord insertion (twin A) to be confirmed on follow-up
 ultrasound.
 -Repeat anti-E antibody titer around 24 weeks.
 -MCA Doppler studies if antibody levels are [DATE] or greater.
 -Consider screening her husband for E antigen status if the
 patient opts for screening.
 -Continue low-dose aspirin.
 -Frequent blood pressure monitoring to rule out chronic
 hypertension.
                 Carlsen, Belem
